# Patient Record
Sex: Female | Born: 1955
Health system: Southern US, Community
[De-identification: ages and names within clinical notes are randomized; demographics above are authoritative.]

## PROBLEM LIST (undated history)

## (undated) DIAGNOSIS — I471 Supraventricular tachycardia, unspecified: Secondary | ICD-10-CM

## (undated) DIAGNOSIS — I472 Ventricular tachycardia, unspecified: Secondary | ICD-10-CM

## (undated) DIAGNOSIS — Z6825 Body mass index (BMI) 25.0-25.9, adult: Secondary | ICD-10-CM

## (undated) HISTORY — PX: BREAST LUMPECTOMY: SHX2

## (undated) HISTORY — PX: CHOLECYSTECTOMY: SHX55

## (undated) HISTORY — DX: Body mass index (BMI) 25.0-25.9, adult: Z68.25

## (undated) HISTORY — DX: Supraventricular tachycardia, unspecified: I47.10

## (undated) HISTORY — PX: DILATION AND CURETTAGE OF UTERUS: SHX78

## (undated) HISTORY — PX: LAMINECTOMY: SHX219

## (undated) HISTORY — DX: Ventricular tachycardia, unspecified: I47.20

---

## 1998-03-28 ENCOUNTER — Inpatient Hospital Stay (HOSPITAL_COMMUNITY): Admission: RE | Admit: 1998-03-28 | Discharge: 1998-03-29 | Payer: Self-pay | Admitting: Neurosurgery

## 2018-04-06 DIAGNOSIS — N950 Postmenopausal bleeding: Secondary | ICD-10-CM

## 2021-08-20 DIAGNOSIS — Z6829 Body mass index (BMI) 29.0-29.9, adult: Secondary | ICD-10-CM | POA: Diagnosis not present

## 2021-08-20 DIAGNOSIS — I10 Essential (primary) hypertension: Secondary | ICD-10-CM | POA: Diagnosis not present

## 2021-08-20 DIAGNOSIS — N6459 Other signs and symptoms in breast: Secondary | ICD-10-CM | POA: Diagnosis not present

## 2021-08-20 DIAGNOSIS — Z1231 Encounter for screening mammogram for malignant neoplasm of breast: Secondary | ICD-10-CM | POA: Diagnosis not present

## 2021-09-11 DIAGNOSIS — N6453 Retraction of nipple: Secondary | ICD-10-CM | POA: Diagnosis not present

## 2021-09-11 DIAGNOSIS — N6459 Other signs and symptoms in breast: Secondary | ICD-10-CM | POA: Diagnosis not present

## 2021-09-11 DIAGNOSIS — R928 Other abnormal and inconclusive findings on diagnostic imaging of breast: Secondary | ICD-10-CM | POA: Diagnosis not present

## 2021-09-26 DIAGNOSIS — R921 Mammographic calcification found on diagnostic imaging of breast: Secondary | ICD-10-CM | POA: Diagnosis not present

## 2021-09-26 DIAGNOSIS — N6489 Other specified disorders of breast: Secondary | ICD-10-CM | POA: Diagnosis not present

## 2021-09-26 DIAGNOSIS — R928 Other abnormal and inconclusive findings on diagnostic imaging of breast: Secondary | ICD-10-CM | POA: Diagnosis not present

## 2021-09-30 DIAGNOSIS — N6331 Unspecified lump in axillary tail of the right breast: Secondary | ICD-10-CM | POA: Diagnosis not present

## 2021-09-30 DIAGNOSIS — R928 Other abnormal and inconclusive findings on diagnostic imaging of breast: Secondary | ICD-10-CM | POA: Diagnosis not present

## 2021-09-30 DIAGNOSIS — R590 Localized enlarged lymph nodes: Secondary | ICD-10-CM | POA: Diagnosis not present

## 2021-09-30 DIAGNOSIS — N6314 Unspecified lump in the right breast, lower inner quadrant: Secondary | ICD-10-CM | POA: Diagnosis not present

## 2021-09-30 DIAGNOSIS — N6315 Unspecified lump in the right breast, overlapping quadrants: Secondary | ICD-10-CM | POA: Diagnosis not present

## 2021-10-04 NOTE — Progress Notes (Signed)
Phone contact with pt who needs surgical referral after breast biopsy. The path was benign. However, the radiologist is recommending a beast MRI and surgical consult. Per the patient's request, an appt was made with Dr. Jerel Shepherd for 10/22/2020. The pt's primary is ordering the MRI.

## 2021-10-22 DIAGNOSIS — R590 Localized enlarged lymph nodes: Secondary | ICD-10-CM | POA: Diagnosis not present

## 2021-10-22 DIAGNOSIS — N6489 Other specified disorders of breast: Secondary | ICD-10-CM | POA: Diagnosis not present

## 2021-10-22 DIAGNOSIS — R928 Other abnormal and inconclusive findings on diagnostic imaging of breast: Secondary | ICD-10-CM | POA: Diagnosis not present

## 2021-10-25 ENCOUNTER — Other Ambulatory Visit: Payer: Self-pay | Admitting: Family Medicine

## 2021-10-25 DIAGNOSIS — N6459 Other signs and symptoms in breast: Secondary | ICD-10-CM

## 2021-11-06 ENCOUNTER — Ambulatory Visit
Admission: RE | Admit: 2021-11-06 | Discharge: 2021-11-06 | Disposition: A | Discharge status: Home / Self Care | Payer: Medicare HMO | Source: Ambulatory Visit | Attending: Family Medicine | Admitting: Family Medicine

## 2021-11-06 DIAGNOSIS — N6459 Other signs and symptoms in breast: Secondary | ICD-10-CM

## 2021-11-06 DIAGNOSIS — N6489 Other specified disorders of breast: Secondary | ICD-10-CM | POA: Diagnosis not present

## 2021-11-06 MED ORDER — GADOBUTROL 1 MMOL/ML IV SOLN
8.0000 mL | Freq: Once | INTRAVENOUS | Status: AC | PRN
  Administered 2021-11-06: 8 mL via INTRAVENOUS

## 2021-11-08 DIAGNOSIS — M85852 Other specified disorders of bone density and structure, left thigh: Secondary | ICD-10-CM | POA: Diagnosis not present

## 2021-11-08 DIAGNOSIS — E2839 Other primary ovarian failure: Secondary | ICD-10-CM | POA: Diagnosis not present

## 2021-11-13 ENCOUNTER — Other Ambulatory Visit: Payer: Self-pay | Admitting: Family Medicine

## 2021-11-13 DIAGNOSIS — R9389 Abnormal findings on diagnostic imaging of other specified body structures: Secondary | ICD-10-CM

## 2021-11-15 ENCOUNTER — Other Ambulatory Visit: Payer: Self-pay | Admitting: Family Medicine

## 2021-11-15 ENCOUNTER — Ambulatory Visit: Payer: Medicare HMO

## 2021-11-15 ENCOUNTER — Other Ambulatory Visit: Payer: Self-pay

## 2021-11-15 ENCOUNTER — Ambulatory Visit
Admission: RE | Admit: 2021-11-15 | Discharge: 2021-11-15 | Disposition: A | Discharge status: Home / Self Care | Payer: Medicare HMO | Source: Ambulatory Visit | Attending: Family Medicine | Admitting: Family Medicine

## 2021-11-15 DIAGNOSIS — R9389 Abnormal findings on diagnostic imaging of other specified body structures: Secondary | ICD-10-CM

## 2021-11-15 DIAGNOSIS — N651 Disproportion of reconstructed breast: Secondary | ICD-10-CM | POA: Diagnosis not present

## 2021-11-15 MED ORDER — GADOBUTROL 1 MMOL/ML IV SOLN: 8.0000 mL | Freq: Once | INTRAVENOUS | Status: DC | PRN

## 2021-11-20 ENCOUNTER — Other Ambulatory Visit: Payer: Self-pay

## 2021-11-20 ENCOUNTER — Ambulatory Visit
Admission: RE | Admit: 2021-11-20 | Discharge: 2021-11-20 | Disposition: A | Discharge status: Home / Self Care | Payer: Medicare HMO | Source: Ambulatory Visit | Attending: Family Medicine | Admitting: Family Medicine

## 2021-11-20 DIAGNOSIS — R9389 Abnormal findings on diagnostic imaging of other specified body structures: Secondary | ICD-10-CM

## 2021-11-20 DIAGNOSIS — R928 Other abnormal and inconclusive findings on diagnostic imaging of breast: Secondary | ICD-10-CM | POA: Diagnosis not present

## 2021-11-20 DIAGNOSIS — N6011 Diffuse cystic mastopathy of right breast: Secondary | ICD-10-CM | POA: Diagnosis not present

## 2021-11-20 MED ORDER — GADOBUTROL 1 MMOL/ML IV SOLN
8.0000 mL | Freq: Once | INTRAVENOUS | Status: AC | PRN
  Administered 2021-11-20: 8 mL via INTRAVENOUS

## 2021-12-04 DIAGNOSIS — N62 Hypertrophy of breast: Secondary | ICD-10-CM | POA: Diagnosis not present

## 2021-12-04 DIAGNOSIS — N6314 Unspecified lump in the right breast, lower inner quadrant: Secondary | ICD-10-CM | POA: Diagnosis not present

## 2021-12-04 DIAGNOSIS — N6011 Diffuse cystic mastopathy of right breast: Secondary | ICD-10-CM | POA: Diagnosis not present

## 2021-12-04 DIAGNOSIS — R928 Other abnormal and inconclusive findings on diagnostic imaging of breast: Secondary | ICD-10-CM | POA: Diagnosis not present

## 2021-12-04 DIAGNOSIS — N6315 Unspecified lump in the right breast, overlapping quadrants: Secondary | ICD-10-CM | POA: Diagnosis not present

## 2021-12-04 DIAGNOSIS — R590 Localized enlarged lymph nodes: Secondary | ICD-10-CM | POA: Diagnosis not present

## 2022-02-20 DIAGNOSIS — E785 Hyperlipidemia, unspecified: Secondary | ICD-10-CM | POA: Diagnosis not present

## 2022-02-20 DIAGNOSIS — R3915 Urgency of urination: Secondary | ICD-10-CM | POA: Diagnosis not present

## 2022-02-20 DIAGNOSIS — Z139 Encounter for screening, unspecified: Secondary | ICD-10-CM | POA: Diagnosis not present

## 2022-02-20 DIAGNOSIS — Z136 Encounter for screening for cardiovascular disorders: Secondary | ICD-10-CM | POA: Diagnosis not present

## 2022-02-20 DIAGNOSIS — E039 Hypothyroidism, unspecified: Secondary | ICD-10-CM | POA: Diagnosis not present

## 2022-02-20 DIAGNOSIS — I10 Essential (primary) hypertension: Secondary | ICD-10-CM | POA: Diagnosis not present

## 2022-02-20 DIAGNOSIS — Z0000 Encounter for general adult medical examination without abnormal findings: Secondary | ICD-10-CM | POA: Diagnosis not present

## 2022-02-20 DIAGNOSIS — Z1339 Encounter for screening examination for other mental health and behavioral disorders: Secondary | ICD-10-CM | POA: Diagnosis not present

## 2022-02-20 DIAGNOSIS — Z1331 Encounter for screening for depression: Secondary | ICD-10-CM | POA: Diagnosis not present

## 2022-06-04 DIAGNOSIS — N6489 Other specified disorders of breast: Secondary | ICD-10-CM | POA: Diagnosis not present

## 2022-06-04 DIAGNOSIS — R590 Localized enlarged lymph nodes: Secondary | ICD-10-CM | POA: Diagnosis not present

## 2022-06-10 DIAGNOSIS — N6011 Diffuse cystic mastopathy of right breast: Secondary | ICD-10-CM | POA: Diagnosis not present

## 2022-06-10 DIAGNOSIS — N6012 Diffuse cystic mastopathy of left breast: Secondary | ICD-10-CM | POA: Diagnosis not present

## 2022-08-25 DIAGNOSIS — I10 Essential (primary) hypertension: Secondary | ICD-10-CM | POA: Diagnosis not present

## 2022-08-25 DIAGNOSIS — E785 Hyperlipidemia, unspecified: Secondary | ICD-10-CM | POA: Diagnosis not present

## 2022-08-25 DIAGNOSIS — E039 Hypothyroidism, unspecified: Secondary | ICD-10-CM | POA: Diagnosis not present

## 2022-09-02 DIAGNOSIS — E039 Hypothyroidism, unspecified: Secondary | ICD-10-CM | POA: Diagnosis not present

## 2022-09-02 DIAGNOSIS — I10 Essential (primary) hypertension: Secondary | ICD-10-CM | POA: Diagnosis not present

## 2022-09-02 DIAGNOSIS — E785 Hyperlipidemia, unspecified: Secondary | ICD-10-CM | POA: Diagnosis not present

## 2022-09-02 DIAGNOSIS — K130 Diseases of lips: Secondary | ICD-10-CM | POA: Diagnosis not present

## 2022-09-02 DIAGNOSIS — Z6825 Body mass index (BMI) 25.0-25.9, adult: Secondary | ICD-10-CM | POA: Diagnosis not present

## 2022-09-17 DIAGNOSIS — G4730 Sleep apnea, unspecified: Secondary | ICD-10-CM | POA: Diagnosis not present

## 2022-10-02 DIAGNOSIS — Z1231 Encounter for screening mammogram for malignant neoplasm of breast: Secondary | ICD-10-CM | POA: Diagnosis not present

## 2022-10-30 DIAGNOSIS — D225 Melanocytic nevi of trunk: Secondary | ICD-10-CM | POA: Diagnosis not present

## 2022-10-30 DIAGNOSIS — L814 Other melanin hyperpigmentation: Secondary | ICD-10-CM | POA: Diagnosis not present

## 2022-10-30 DIAGNOSIS — L821 Other seborrheic keratosis: Secondary | ICD-10-CM | POA: Diagnosis not present

## 2022-10-30 DIAGNOSIS — L578 Other skin changes due to chronic exposure to nonionizing radiation: Secondary | ICD-10-CM | POA: Diagnosis not present

## 2022-10-30 DIAGNOSIS — D485 Neoplasm of uncertain behavior of skin: Secondary | ICD-10-CM | POA: Diagnosis not present

## 2022-11-26 IMAGING — MR MR BREAST*R* W/O CM
2 series · 40 of 48 positions shown · IV contrast (gadavist)
Comparison: November 06, 2021

CLINICAL DATA: The patient presented for biopsy of an enhancing
mass and distortion in the inner right breast.

EXAM:
MR OF THE RIGHT BREAST WITH AND WITHOUT CONTRAST
TECHNIQUE: Multiplanar, multisequence MR images of the right breast were
obtained prior to and following the intravenous administration of 0
ml of Gadavist.

[Series 2: fiducial unilateral · sagittal · 2.0mm · 1.33mm/px · 11 of 52 slices shown]
[im 1/52]
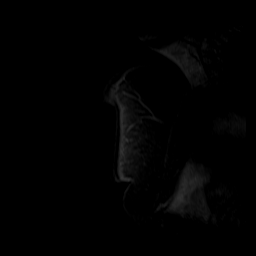
[im 6/52]
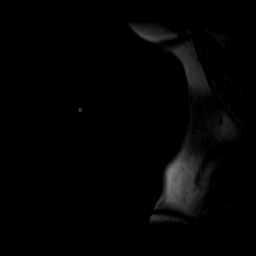
[im 11/52]
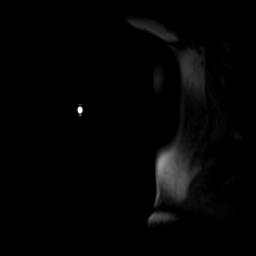
[im 16/52]
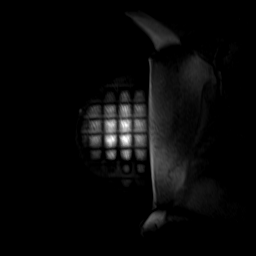
[im 21/52]
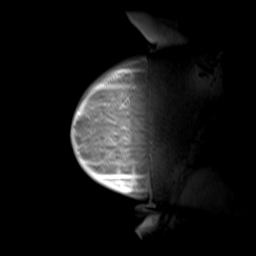
[im 26/52]
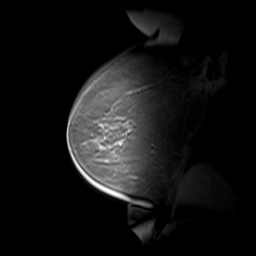
[im 31/52]
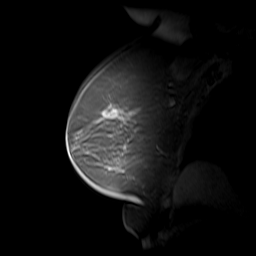
[im 36/52]
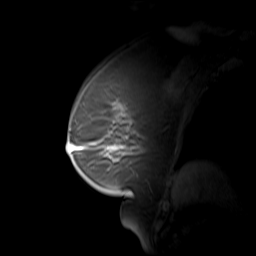
[im 41/52]
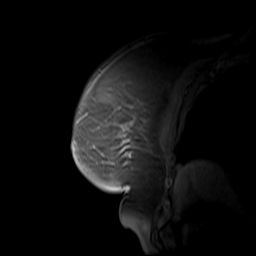
[im 46/52]
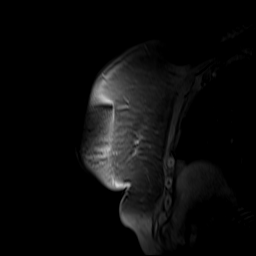
[im 52/52]
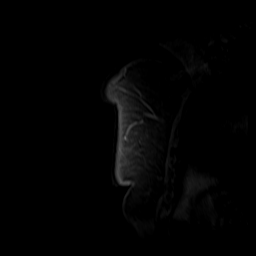

[Series 3: dynamic pre · axial · non-contrast · 1.3mm · 0.73mm/px · z∈[-76,+138]mm · 29 of 176 slices shown]
[im 1/176]
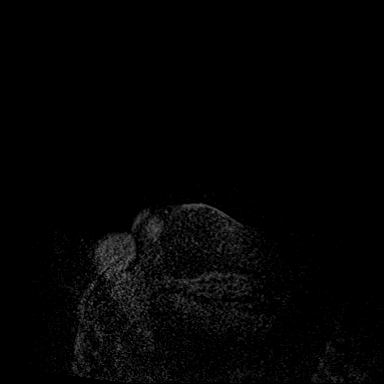
[im 5/176]
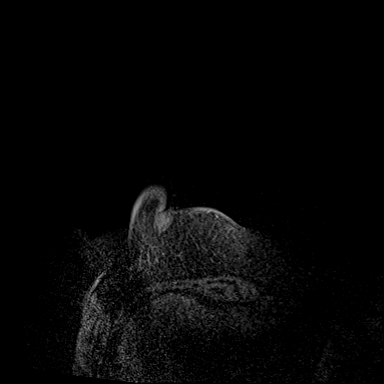
[im 10/176]
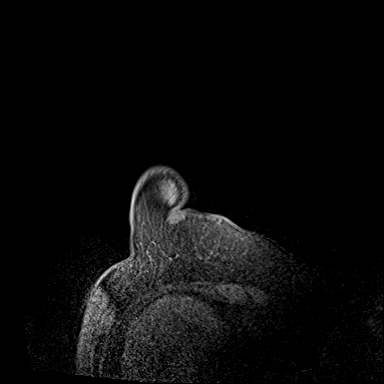
[im 15/176]
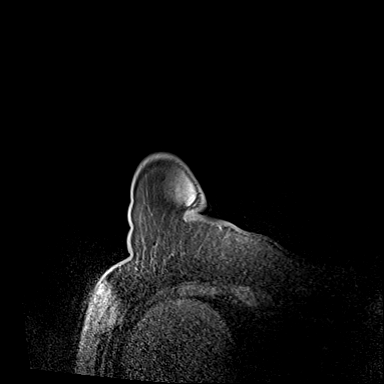
[im 20/176]
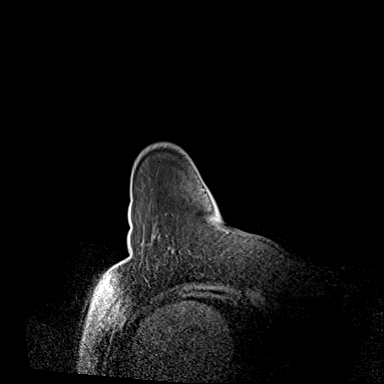
[im 25/176]
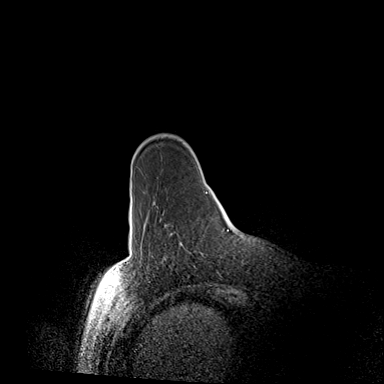
[im 30/176]
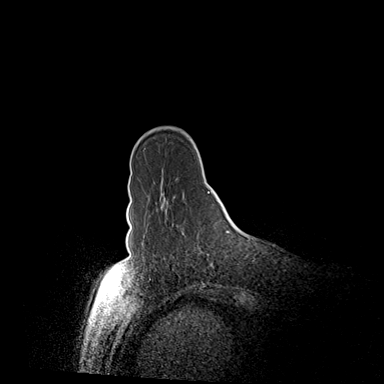
[im 35/176]
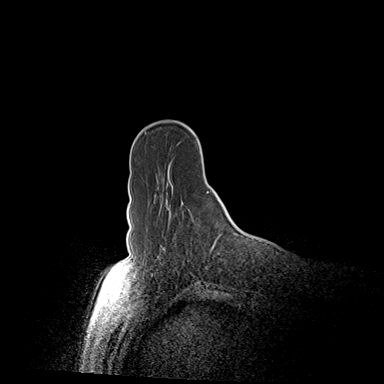
[im 39/176]
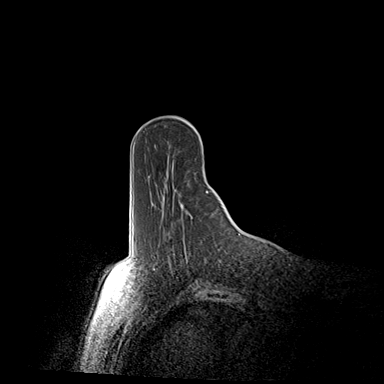
[im 44/176]
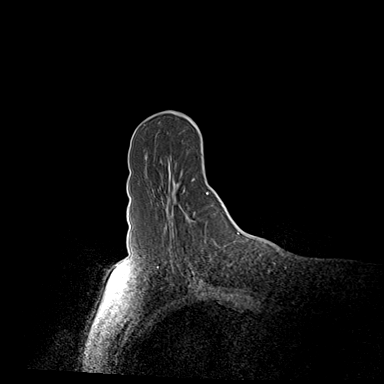
[im 49/176]
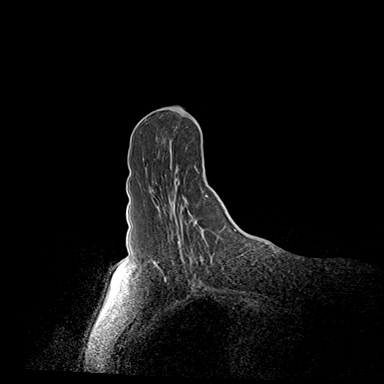
[im 54/176]
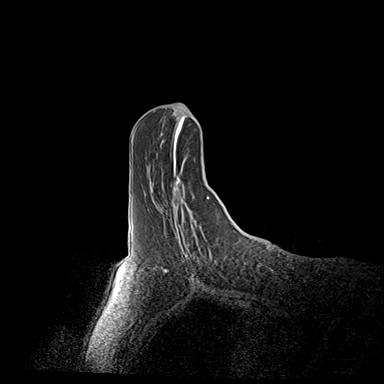
[im 59/176]
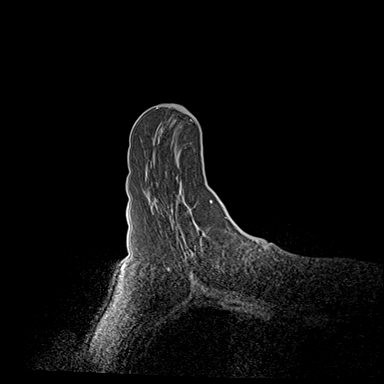
[im 64/176]
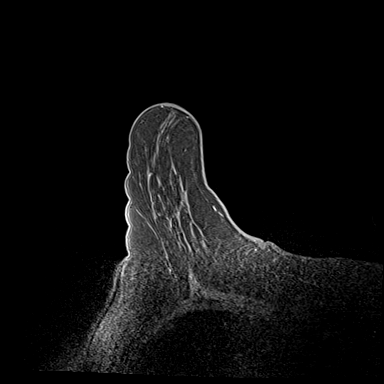
[im 69/176]
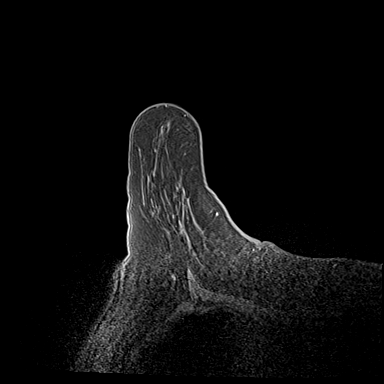
[im 73/176]
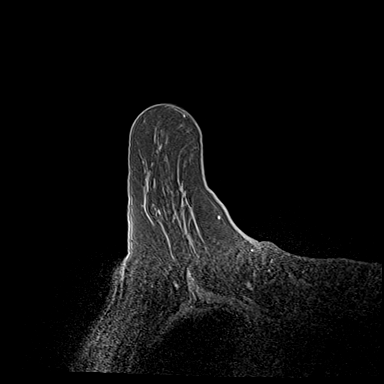
[im 78/176]
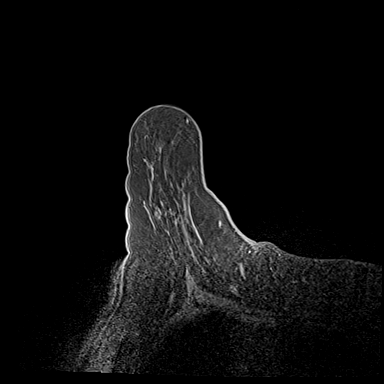
[im 83/176]
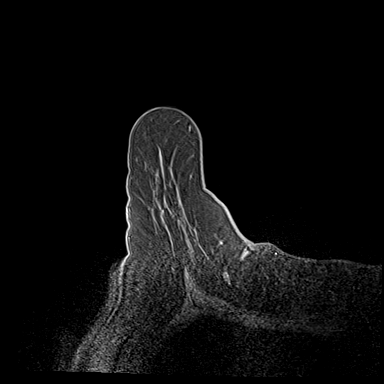
[im 88/176]
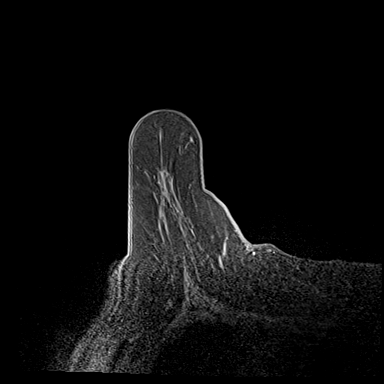
[im 93/176]
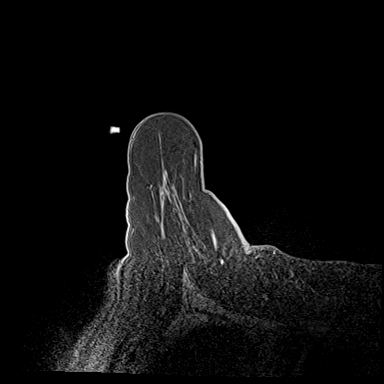
[im 98/176]
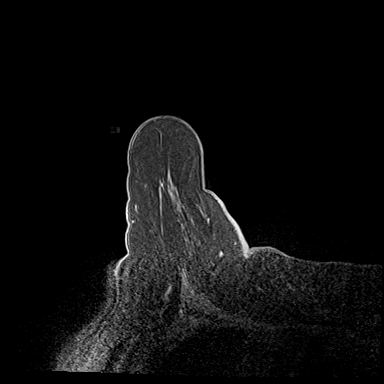
[im 103/176]
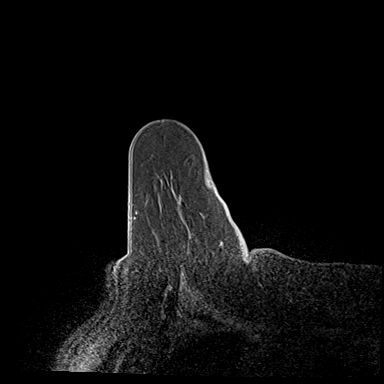
[im 107/176]
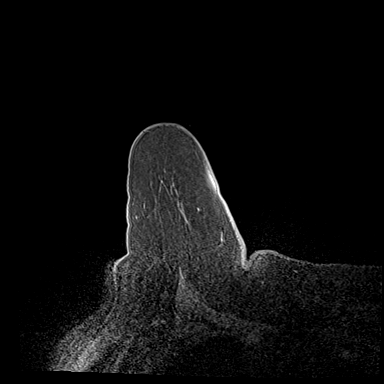
[im 112/176]
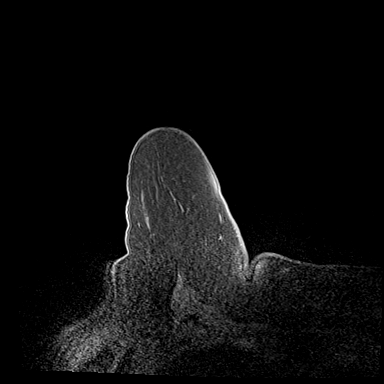
[im 117/176]
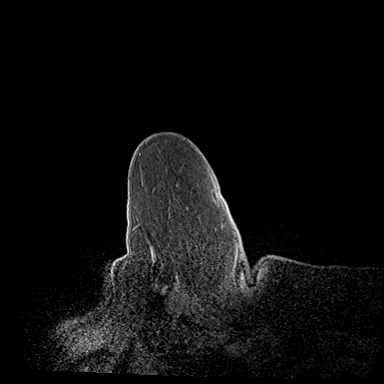
[im 122/176]
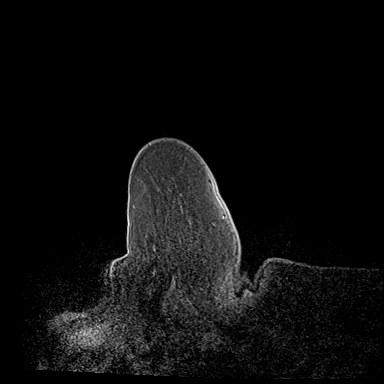
[im 127/176]
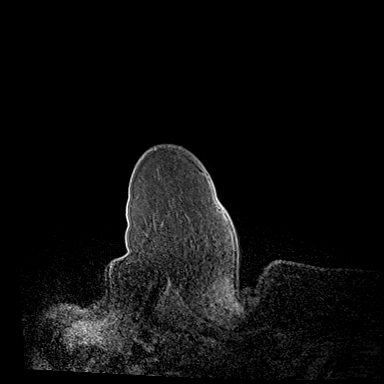
[im 146/176]
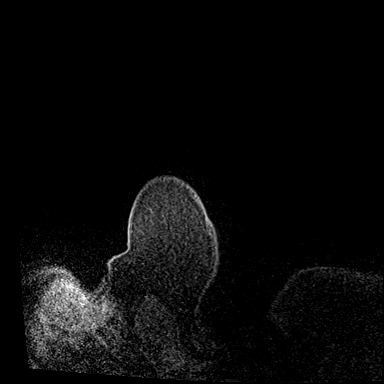
[im 166/176]
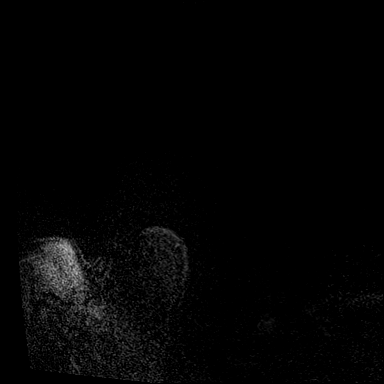

[40 of 48 positions shown; findings below may reference images not displayed]

Three-dimensional MR images were rendered by post-processing of the
original MR data on an independent workstation. The
three-dimensional MR images were interpreted, and findings are
reported in the following complete MRI report for this study. Three
dimensional images were evaluated at the independent [employer]
workstation
FINDINGS: A scout and a pre contrast images were obtained. The patient then
had a vasovagal reaction and 1 is in the able to proceed.
IMPRESSION: The MRI biopsy was canceled due to a vasovagal reaction. The patient
will be rescheduled. We will ask the referring physician to
prescribe an anxiolytic prior to the next attempt.

RECOMMENDATION:
The patient will be rescheduled for her biopsy.

BI-RADS CATEGORY  1: Negative.

## 2022-12-01 IMAGING — MR MR BREAST BX W/ LOC DEV 1ST LEASION IMAGE BX SPEC MR GUIDE*R*
7 of 10 series · 33 of 48 positions shown · IV contrast (gadavist)
Comparison: Previous exams.
COMPARISON: Previous exams.

Addendum:
CLINICAL DATA: 65-year-old female presents for MRI guided biopsy
enhancing mass/area of distortion in the inner right breast.

EXAM:
MRI GUIDED CORE NEEDLE BIOPSY OF THE RIGHT BREAST
TECHNIQUE: Multiplanar, multisequence MR imaging of the right breast was
performed both before and after administration of intravenous
contrast.
CONTRAST:  8mL GADAVIST GADOBUTROL 1 MMOL/ML IV SOLN

[Series 2: fiducial unilateral · sagittal · 2.0mm · 1.33mm/px · 3 of 52 slices shown]
[im 1/52]
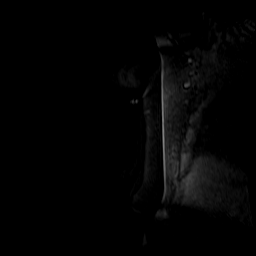
[im 26/52]
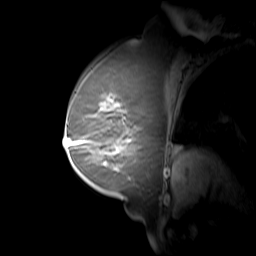
[im 52/52]
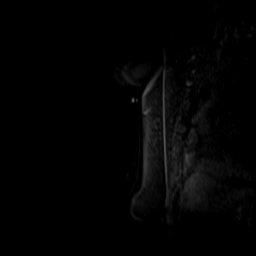

[Series 3: dynamic pre · axial · non-contrast · 1.3mm · 0.73mm/px · z∈[-86,+100]mm · 5 of 144 slices shown]
[im 1/144]
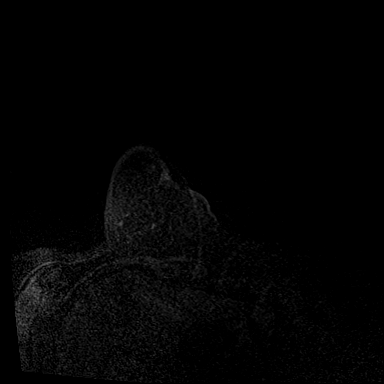
[im 36/144]
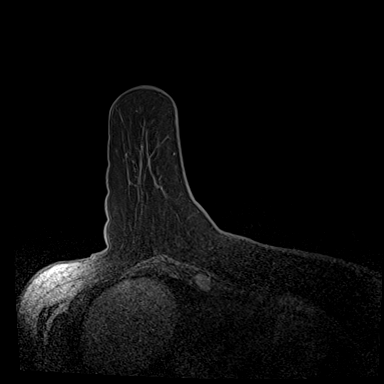
[im 72/144]
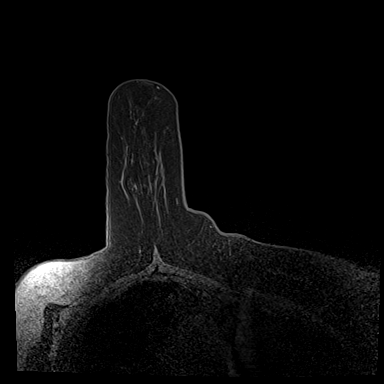
[im 108/144]
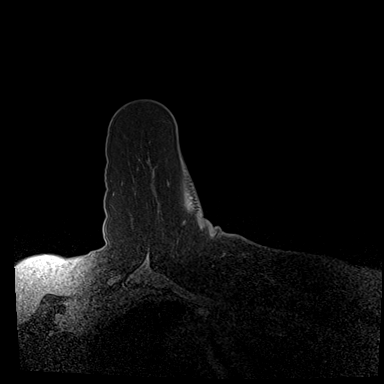
[im 144/144]
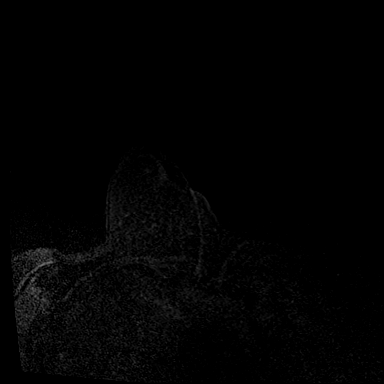

[Series 4: dynamic post 20 · axial · 1.3mm · 0.73mm/px · z∈[-86,+100]mm · 5 of 144 slices shown (1 of 2)]
[im 1/144]
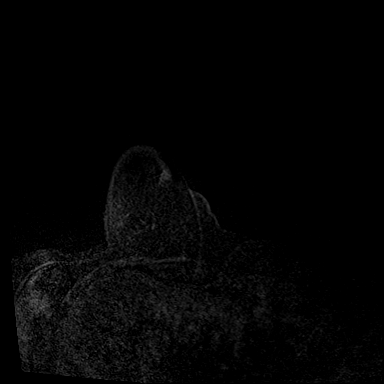
[im 36/144]
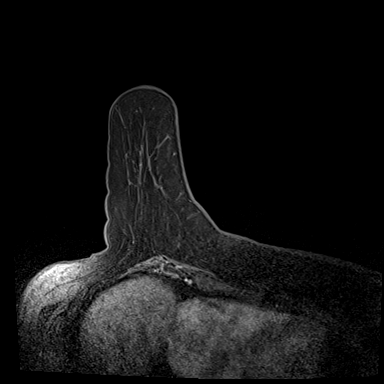
[im 72/144]
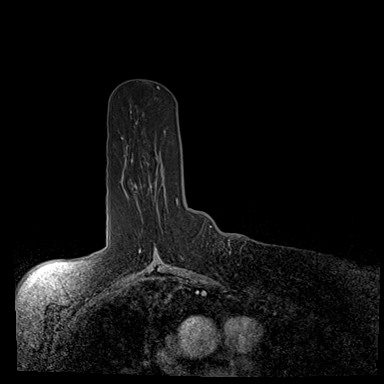
[im 108/144]
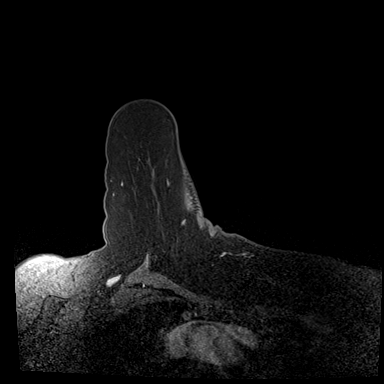
[im 144/144]
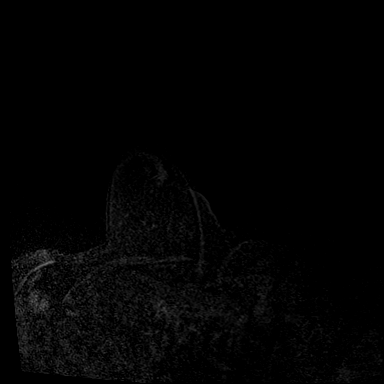

[Series 5: dynamic post 20 · axial · 1.3mm · 0.73mm/px · z∈[-86,+100]mm · 5 of 144 slices shown (2 of 2)]
[im 1/144]
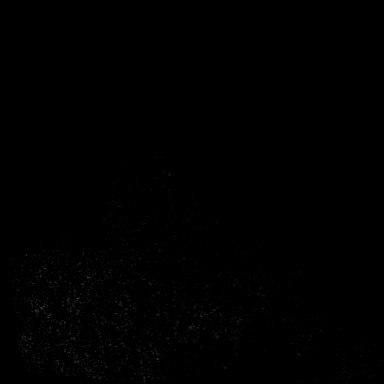
[im 36/144]
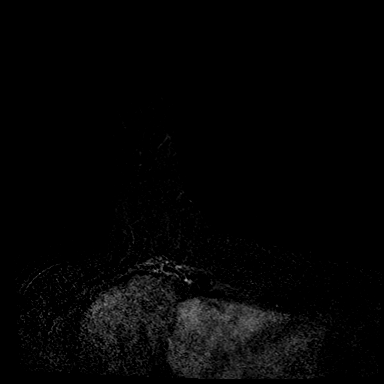
[im 72/144]
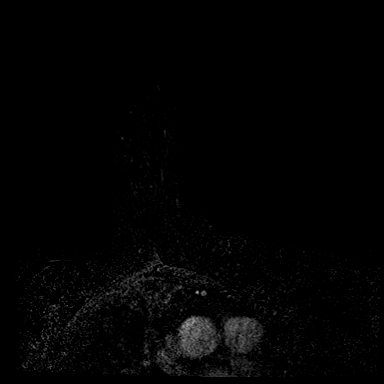
[im 108/144]
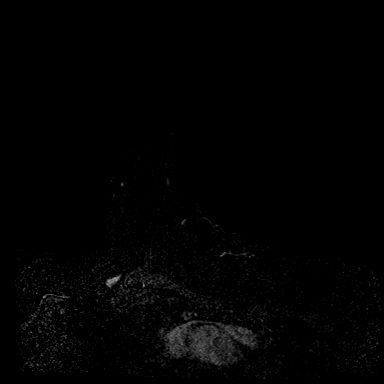
[im 144/144]
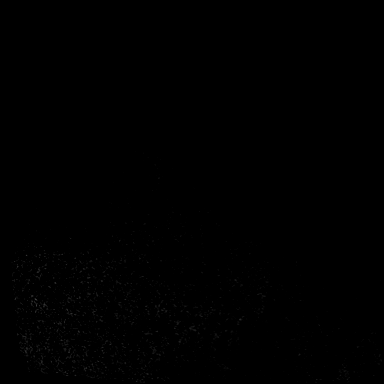

[Series 6: dynamic post 3 · axial · 1.3mm · 0.73mm/px · z∈[-86,+100]mm · 5 of 144 slices shown (1 of 2)]
[im 1/144]
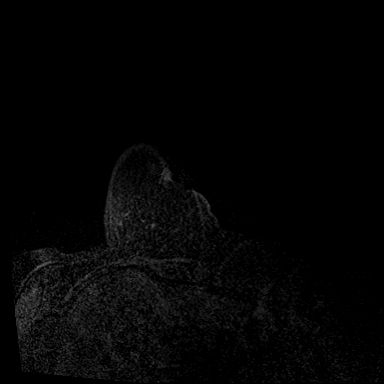
[im 36/144]
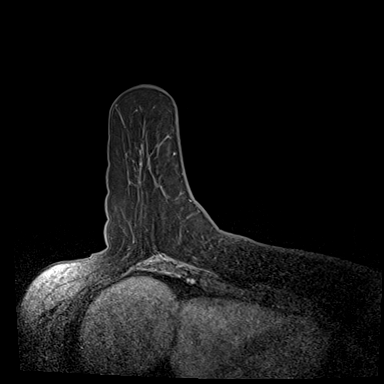
[im 72/144]
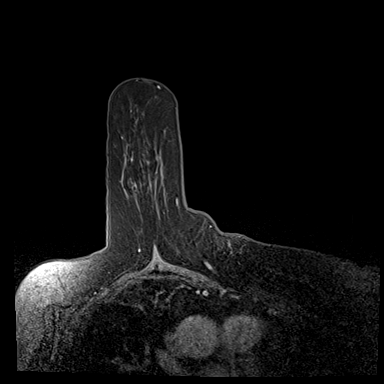
[im 108/144]
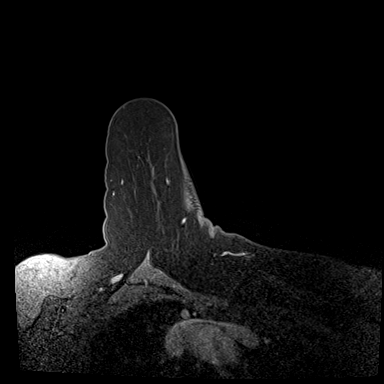
[im 144/144]
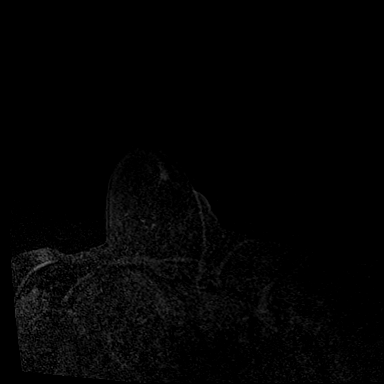

[Series 7: dynamic post 3 · axial · 1.3mm · 0.73mm/px · z∈[-86,+100]mm · 5 of 144 slices shown (2 of 2)]
[im 1/144]
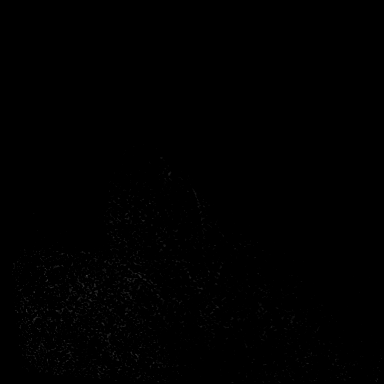
[im 36/144]
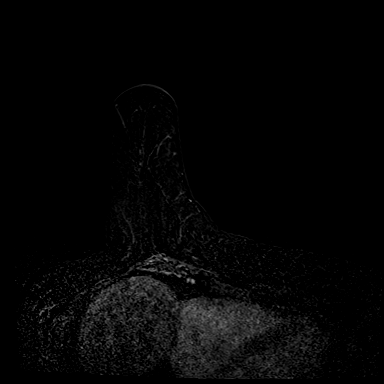
[im 72/144]
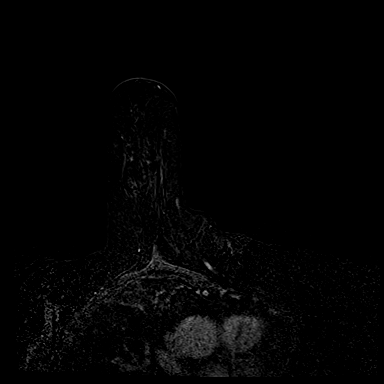
[im 108/144]
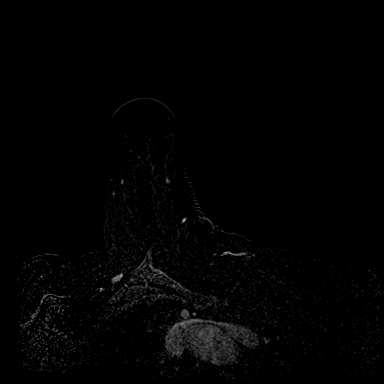
[im 144/144]
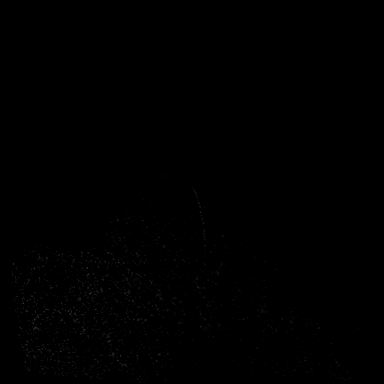

[Series 8: needle confirmation · axial · 1.3mm · 0.73mm/px · z∈[-86,+100]mm · 5 of 144 slices shown]
[im 1/144]
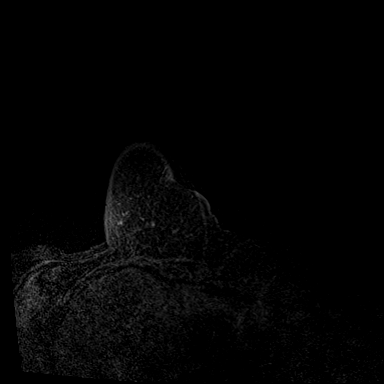
[im 36/144]
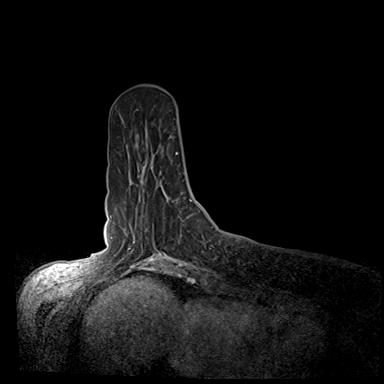
[im 72/144]
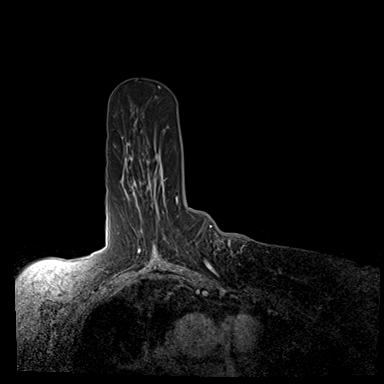
[im 108/144]
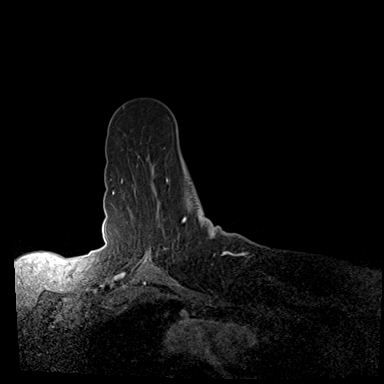
[im 144/144]
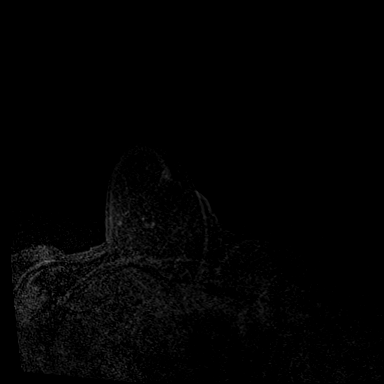

[33 of 48 positions shown; findings below may reference images not displayed]

FINDINGS: I met with the patient, and we discussed the procedure of MRI guided
biopsy, including risks, benefits, and alternatives. Specifically,
we discussed the risks of infection, bleeding, tissue injury, clip
migration, and inadequate sampling. Informed, written consent was
given. The usual time out protocol was performed immediately prior
to the procedure.

Using sterile technique, 1% Lidocaine, MRI guidance, and a 9 gauge
vacuum assisted device, biopsy was performed of enhancing
mass/distortion in the slightly inner right breast using a lateral
to medial approach. At the conclusion of the procedure, a dumbbell
shaped tissue marker clip was deployed into the biopsy cavity.
Follow-up 2-view mammogram was performed and dictated separately.
IMPRESSION: MRI guided biopsy of the enhancing mass/distortion in the inner
right breast. No apparent complications.

ADDENDUM:
Pathology revealed COLUMNAR CELL AND FIBROCYSTIC CHANGES-
FIBROELASTIC STROMA WITH PIGMENTED HISTIOCYTES CONSISTENT WITH
PREVIOUS BIOPSY SITE- NO MALIGNANCY IDENTIFIED of the RIGHT breast,
inner (dumbbell clip). This was found to be DISCORDANT by Dr.
Gab Tiger.

Pathology results were discussed with the patient by telephone. The
patient reported doing well after the biopsy with tenderness at the
site. Post biopsy instructions and care were reviewed and questions
were answered. The patient was encouraged to call The [REDACTED]

Surgical excision was recommended to the patient. This was discussed
with Ms. Rosenborg At [DATE] a.m. 11/26/2021 via telephone. The patient
states she has already been in touch with her surgeon Dr. Renardo
La and she will be following up to discuss results and
schedule excision.

Pathology results reported by Brittany Orea RN on 11/21/2021.

*** End of Addendum ***
FINDINGS: I met with the patient, and we discussed the procedure of MRI guided
biopsy, including risks, benefits, and alternatives. Specifically,
we discussed the risks of infection, bleeding, tissue injury, clip
migration, and inadequate sampling. Informed, written consent was
given. The usual time out protocol was performed immediately prior
to the procedure.

Using sterile technique, 1% Lidocaine, MRI guidance, and a 9 gauge
vacuum assisted device, biopsy was performed of enhancing
mass/distortion in the slightly inner right breast using a lateral
to medial approach. At the conclusion of the procedure, a dumbbell
shaped tissue marker clip was deployed into the biopsy cavity.
Follow-up 2-view mammogram was performed and dictated separately.
IMPRESSION: MRI guided biopsy of the enhancing mass/distortion in the inner
right breast. No apparent complications.

## 2022-12-02 DIAGNOSIS — Z1211 Encounter for screening for malignant neoplasm of colon: Secondary | ICD-10-CM | POA: Diagnosis not present

## 2022-12-02 DIAGNOSIS — N6012 Diffuse cystic mastopathy of left breast: Secondary | ICD-10-CM | POA: Diagnosis not present

## 2022-12-02 DIAGNOSIS — N6011 Diffuse cystic mastopathy of right breast: Secondary | ICD-10-CM | POA: Diagnosis not present

## 2023-02-13 ENCOUNTER — Telehealth: Payer: Self-pay | Admitting: Gastroenterology

## 2023-02-13 NOTE — Telephone Encounter (Signed)
Darl Pikes from [facility] called and stated that the patient is due for a procedure but need clearance from [place] to be able to continue. The patient stated that she had a colonoscopy with Dr.Gupta but does not know where or when and she is trying to find out so the surgery can be approved. A good phone number to call back on is [phone] and ask for Darl Pikes. Stated they close at 66 today. Please advise, thank you.

## 2023-02-17 NOTE — Telephone Encounter (Signed)
Sent report from 08-20-2023 to F [phone] attn Darl Pikes

## 2023-02-17 NOTE — Telephone Encounter (Signed)
Please see note below and please assist.

## 2023-02-24 DIAGNOSIS — E039 Hypothyroidism, unspecified: Secondary | ICD-10-CM | POA: Diagnosis not present

## 2023-02-24 DIAGNOSIS — E785 Hyperlipidemia, unspecified: Secondary | ICD-10-CM | POA: Diagnosis not present

## 2023-02-24 DIAGNOSIS — I10 Essential (primary) hypertension: Secondary | ICD-10-CM | POA: Diagnosis not present

## 2023-02-26 DIAGNOSIS — Z1211 Encounter for screening for malignant neoplasm of colon: Secondary | ICD-10-CM | POA: Diagnosis not present

## 2023-03-03 DIAGNOSIS — E785 Hyperlipidemia, unspecified: Secondary | ICD-10-CM | POA: Diagnosis not present

## 2023-03-03 DIAGNOSIS — Z1331 Encounter for screening for depression: Secondary | ICD-10-CM | POA: Diagnosis not present

## 2023-03-03 DIAGNOSIS — G4733 Obstructive sleep apnea (adult) (pediatric): Secondary | ICD-10-CM | POA: Diagnosis not present

## 2023-03-03 DIAGNOSIS — Z0000 Encounter for general adult medical examination without abnormal findings: Secondary | ICD-10-CM | POA: Diagnosis not present

## 2023-03-03 DIAGNOSIS — Z136 Encounter for screening for cardiovascular disorders: Secondary | ICD-10-CM | POA: Diagnosis not present

## 2023-03-03 DIAGNOSIS — Z1339 Encounter for screening examination for other mental health and behavioral disorders: Secondary | ICD-10-CM | POA: Diagnosis not present

## 2023-03-03 DIAGNOSIS — I10 Essential (primary) hypertension: Secondary | ICD-10-CM | POA: Diagnosis not present

## 2023-03-03 DIAGNOSIS — E039 Hypothyroidism, unspecified: Secondary | ICD-10-CM | POA: Diagnosis not present

## 2023-03-03 DIAGNOSIS — Z139 Encounter for screening, unspecified: Secondary | ICD-10-CM | POA: Diagnosis not present

## 2023-04-10 DIAGNOSIS — Z8616 Personal history of COVID-19: Secondary | ICD-10-CM | POA: Diagnosis not present

## 2023-04-10 DIAGNOSIS — G4733 Obstructive sleep apnea (adult) (pediatric): Secondary | ICD-10-CM | POA: Diagnosis not present

## 2023-04-10 DIAGNOSIS — R5383 Other fatigue: Secondary | ICD-10-CM | POA: Diagnosis not present

## 2023-04-29 DIAGNOSIS — G4733 Obstructive sleep apnea (adult) (pediatric): Secondary | ICD-10-CM | POA: Diagnosis not present

## 2023-04-29 DIAGNOSIS — Z711 Person with feared health complaint in whom no diagnosis is made: Secondary | ICD-10-CM | POA: Diagnosis not present

## 2023-05-06 DIAGNOSIS — R400 Somnolence: Secondary | ICD-10-CM | POA: Diagnosis not present

## 2023-05-06 DIAGNOSIS — R5383 Other fatigue: Secondary | ICD-10-CM | POA: Diagnosis not present

## 2023-08-27 DIAGNOSIS — E785 Hyperlipidemia, unspecified: Secondary | ICD-10-CM | POA: Diagnosis not present

## 2023-08-27 DIAGNOSIS — E039 Hypothyroidism, unspecified: Secondary | ICD-10-CM | POA: Diagnosis not present

## 2023-08-27 DIAGNOSIS — I10 Essential (primary) hypertension: Secondary | ICD-10-CM | POA: Diagnosis not present

## 2023-09-03 DIAGNOSIS — Z6824 Body mass index (BMI) 24.0-24.9, adult: Secondary | ICD-10-CM | POA: Diagnosis not present

## 2023-09-03 DIAGNOSIS — I10 Essential (primary) hypertension: Secondary | ICD-10-CM | POA: Diagnosis not present

## 2023-09-03 DIAGNOSIS — E039 Hypothyroidism, unspecified: Secondary | ICD-10-CM | POA: Diagnosis not present

## 2023-09-03 DIAGNOSIS — S99921A Unspecified injury of right foot, initial encounter: Secondary | ICD-10-CM | POA: Diagnosis not present

## 2023-09-03 DIAGNOSIS — E785 Hyperlipidemia, unspecified: Secondary | ICD-10-CM | POA: Diagnosis not present

## 2023-10-05 DIAGNOSIS — Z1231 Encounter for screening mammogram for malignant neoplasm of breast: Secondary | ICD-10-CM | POA: Diagnosis not present

## 2023-11-23 DIAGNOSIS — N6012 Diffuse cystic mastopathy of left breast: Secondary | ICD-10-CM | POA: Diagnosis not present

## 2023-11-23 DIAGNOSIS — N6011 Diffuse cystic mastopathy of right breast: Secondary | ICD-10-CM | POA: Diagnosis not present

## 2023-12-15 ENCOUNTER — Encounter: Payer: Self-pay | Admitting: Gastroenterology

## 2023-12-22 DIAGNOSIS — H2513 Age-related nuclear cataract, bilateral: Secondary | ICD-10-CM | POA: Diagnosis not present

## 2023-12-22 DIAGNOSIS — H2511 Age-related nuclear cataract, right eye: Secondary | ICD-10-CM | POA: Diagnosis not present

## 2023-12-22 DIAGNOSIS — Z01818 Encounter for other preprocedural examination: Secondary | ICD-10-CM | POA: Diagnosis not present

## 2024-01-26 DIAGNOSIS — E039 Hypothyroidism, unspecified: Secondary | ICD-10-CM | POA: Diagnosis not present

## 2024-01-26 DIAGNOSIS — H259 Unspecified age-related cataract: Secondary | ICD-10-CM | POA: Diagnosis not present

## 2024-01-26 DIAGNOSIS — H43393 Other vitreous opacities, bilateral: Secondary | ICD-10-CM | POA: Diagnosis not present

## 2024-01-26 DIAGNOSIS — H04123 Dry eye syndrome of bilateral lacrimal glands: Secondary | ICD-10-CM | POA: Diagnosis not present

## 2024-01-26 DIAGNOSIS — Z79899 Other long term (current) drug therapy: Secondary | ICD-10-CM | POA: Diagnosis not present

## 2024-01-26 DIAGNOSIS — H2511 Age-related nuclear cataract, right eye: Secondary | ICD-10-CM | POA: Diagnosis not present

## 2024-01-26 DIAGNOSIS — H524 Presbyopia: Secondary | ICD-10-CM | POA: Diagnosis not present

## 2024-01-26 DIAGNOSIS — Z961 Presence of intraocular lens: Secondary | ICD-10-CM | POA: Diagnosis not present

## 2024-01-26 DIAGNOSIS — K219 Gastro-esophageal reflux disease without esophagitis: Secondary | ICD-10-CM | POA: Diagnosis not present

## 2024-01-26 DIAGNOSIS — H40013 Open angle with borderline findings, low risk, bilateral: Secondary | ICD-10-CM | POA: Diagnosis not present

## 2024-01-26 DIAGNOSIS — H25812 Combined forms of age-related cataract, left eye: Secondary | ICD-10-CM | POA: Diagnosis not present

## 2024-01-26 DIAGNOSIS — H52223 Regular astigmatism, bilateral: Secondary | ICD-10-CM | POA: Diagnosis not present

## 2024-01-26 DIAGNOSIS — H25811 Combined forms of age-related cataract, right eye: Secondary | ICD-10-CM | POA: Diagnosis not present

## 2024-01-26 DIAGNOSIS — H02834 Dermatochalasis of left upper eyelid: Secondary | ICD-10-CM | POA: Diagnosis not present

## 2024-01-26 DIAGNOSIS — I10 Essential (primary) hypertension: Secondary | ICD-10-CM | POA: Diagnosis not present

## 2024-02-09 DIAGNOSIS — H25812 Combined forms of age-related cataract, left eye: Secondary | ICD-10-CM | POA: Diagnosis not present

## 2024-02-09 DIAGNOSIS — H259 Unspecified age-related cataract: Secondary | ICD-10-CM | POA: Diagnosis not present

## 2024-02-09 DIAGNOSIS — H25811 Combined forms of age-related cataract, right eye: Secondary | ICD-10-CM | POA: Diagnosis not present

## 2024-02-09 DIAGNOSIS — I10 Essential (primary) hypertension: Secondary | ICD-10-CM | POA: Diagnosis not present

## 2024-02-09 DIAGNOSIS — H52223 Regular astigmatism, bilateral: Secondary | ICD-10-CM | POA: Diagnosis not present

## 2024-02-09 DIAGNOSIS — K219 Gastro-esophageal reflux disease without esophagitis: Secondary | ICD-10-CM | POA: Diagnosis not present

## 2024-03-01 DIAGNOSIS — E785 Hyperlipidemia, unspecified: Secondary | ICD-10-CM | POA: Diagnosis not present

## 2024-03-01 DIAGNOSIS — E039 Hypothyroidism, unspecified: Secondary | ICD-10-CM | POA: Diagnosis not present

## 2024-03-01 DIAGNOSIS — I10 Essential (primary) hypertension: Secondary | ICD-10-CM | POA: Diagnosis not present

## 2024-03-09 DIAGNOSIS — Z139 Encounter for screening, unspecified: Secondary | ICD-10-CM | POA: Diagnosis not present

## 2024-03-09 DIAGNOSIS — E039 Hypothyroidism, unspecified: Secondary | ICD-10-CM | POA: Diagnosis not present

## 2024-03-09 DIAGNOSIS — Z1389 Encounter for screening for other disorder: Secondary | ICD-10-CM | POA: Diagnosis not present

## 2024-03-09 DIAGNOSIS — E785 Hyperlipidemia, unspecified: Secondary | ICD-10-CM | POA: Diagnosis not present

## 2024-03-09 DIAGNOSIS — I10 Essential (primary) hypertension: Secondary | ICD-10-CM | POA: Diagnosis not present

## 2024-03-09 DIAGNOSIS — Z6824 Body mass index (BMI) 24.0-24.9, adult: Secondary | ICD-10-CM | POA: Diagnosis not present

## 2024-03-09 DIAGNOSIS — Z0000 Encounter for general adult medical examination without abnormal findings: Secondary | ICD-10-CM | POA: Diagnosis not present

## 2024-03-09 DIAGNOSIS — R002 Palpitations: Secondary | ICD-10-CM | POA: Diagnosis not present

## 2024-03-09 DIAGNOSIS — Z1339 Encounter for screening examination for other mental health and behavioral disorders: Secondary | ICD-10-CM | POA: Diagnosis not present

## 2024-03-28 DIAGNOSIS — R002 Palpitations: Secondary | ICD-10-CM | POA: Diagnosis not present

## 2024-04-07 DIAGNOSIS — I471 Supraventricular tachycardia, unspecified: Secondary | ICD-10-CM | POA: Diagnosis not present

## 2024-04-07 DIAGNOSIS — Z6825 Body mass index (BMI) 25.0-25.9, adult: Secondary | ICD-10-CM | POA: Diagnosis not present

## 2024-04-07 DIAGNOSIS — I472 Ventricular tachycardia, unspecified: Secondary | ICD-10-CM | POA: Diagnosis not present

## 2024-04-13 ENCOUNTER — Other Ambulatory Visit: Payer: Self-pay

## 2024-04-13 DIAGNOSIS — I4729 Other ventricular tachycardia: Secondary | ICD-10-CM | POA: Insufficient documentation

## 2024-04-13 DIAGNOSIS — I471 Supraventricular tachycardia, unspecified: Secondary | ICD-10-CM | POA: Insufficient documentation

## 2024-04-13 DIAGNOSIS — I472 Ventricular tachycardia, unspecified: Secondary | ICD-10-CM | POA: Insufficient documentation

## 2024-04-13 DIAGNOSIS — Z6825 Body mass index (BMI) 25.0-25.9, adult: Secondary | ICD-10-CM | POA: Insufficient documentation

## 2024-04-27 ENCOUNTER — Ambulatory Visit

## 2024-04-27 VITALS — BP 130/90 | HR 77 | Ht 65.0 in | Wt 149.0 lb

## 2024-04-27 DIAGNOSIS — I4729 Other ventricular tachycardia: Secondary | ICD-10-CM

## 2024-04-27 DIAGNOSIS — I10 Essential (primary) hypertension: Secondary | ICD-10-CM

## 2024-04-27 DIAGNOSIS — I471 Supraventricular tachycardia, unspecified: Secondary | ICD-10-CM | POA: Diagnosis not present

## 2024-04-27 HISTORY — DX: Essential (primary) hypertension: I10

## 2024-04-27 MED ORDER — CARVEDILOL 3.125 MG PO TABS: 3.1250 mg | ORAL_TABLET | Freq: Two times a day (BID) | ORAL | 3 refills | Status: DC

## 2024-04-27 NOTE — Assessment & Plan Note (Signed)
 Suboptimal control. Currently on amlodipine 2.5 mg once daily.  Tolerating well. Would recommend starting carvedilol  as reviewed above under NSVT, starting 3.125 mg twice daily.

## 2024-04-27 NOTE — Assessment & Plan Note (Signed)
 1 episode of 6 beat NSVT, symptomatic on Zio patch May 2025.  Has potential risk factors in the form of prior history of sleep apnea, hypertension.  Will further evaluate for structural issue with transthoracic echocardiogram. Will also evaluate with treadmill EKG stress test with nuclear imaging given the symptomatic NSVT episodes to rule out any ischemia triggered events.  Will add low-dose beta-blocker carvedilol  3.125 mg twice daily, discussed mechanism of action and potential side effects. Patient also help with blood pressure optimization.

## 2024-04-27 NOTE — Assessment & Plan Note (Signed)
 Runs of SVT noted on telemetry 12 episodes noted with the longest episode lasting 38 seconds and appears consistent with ectopic atrial tachycardia.  Continue workup with echocardiogram as reviewed above under NSVT. Potential triggers reviewed. Recommended further evaluation for sleep apnea, reevaluation through PCPs office and she feels her sleep quality was better while on CPAP.

## 2024-04-27 NOTE — Progress Notes (Signed)
 Cardiology Consultation:    Date:  04/27/2024   ID:  Christy Crane, DOB May 26, 1956, MRN [id number]  PCP:  Patient, No Pcp Per  Cardiologist:  Alean JONELLE Kobus, MD   Referring MD: Melonie Colonel, Mikel HERO, *   No chief complaint on file.    ASSESSMENT AND PLAN:   Christy Crane 68 year old woman with history of hypertension, hyperlipidemia, hypothyroidism, previously sleep apnea [rechecked around 2024/02/16with home sleep study and noted to have normal sleep apnea symptoms and CPAP discontinued].  Problem List Items Addressed This Visit     SVT (supraventricular tachycardia) (HCC)   Runs of SVT noted on telemetry 12 episodes noted with the longest episode lasting 38 seconds and appears consistent with ectopic atrial tachycardia.  Continue workup with echocardiogram as reviewed above under NSVT. Potential triggers reviewed. Recommended further evaluation for sleep apnea, reevaluation through PCPs office and she feels her sleep quality was better while on CPAP.        Relevant Orders   EKG 12-Lead (Completed)   NSVT (nonsustained ventricular tachycardia) (HCC) - Primary   1 episode of 6 beat NSVT, symptomatic on Zio patch May 2025.  Has potential risk factors in the form of prior history of sleep apnea, hypertension.  Will further evaluate for structural issue with transthoracic echocardiogram. Will also evaluate with treadmill EKG stress test with nuclear imaging given the symptomatic NSVT episodes to rule out any ischemia triggered events.  Will add low-dose beta-blocker carvedilol  3.125 mg twice daily, discussed mechanism of action and potential side effects. Patient also help with blood pressure optimization.       Relevant Orders   Cardiac Stress Test: Informed Consent Details: Physician/Practitioner Attestation; Transcribe to consent form and obtain patient signature   Hypertension   Suboptimal control. Currently on amlodipine 2.5 mg once daily.  Tolerating  well. Would recommend starting carvedilol  as reviewed above under NSVT, starting 3.125 mg twice daily.      Return to clinic tentatively in 3 months.   History of Present Illness:    Christy Crane is a 68 y.o. female who is being seen today for the evaluation of 6 beat run of NSVT noted on heart monitor that was seemingly symptomatic and multiple runs of SVT with the longest lasting 38 seconds that was asymptomatic at the request of Melonie Colonel, Irma M, *.  Pleasant woman here for the visit by herself.  Lives by herself at home since her husband passed away in 2022/12/05.  Keeps herself busy with household activities in the yard and also regularly exercises 30 minutes on the stationary bike and light strength training.  And lost about 90 pounds since 2020.  Has history of hypertension, hyperlipidemia, hypothyroidism, previously sleep apnea [rechecked around 2024/02/16with home sleep study and noted to have normal sleep apnea symptoms and CPAP discontinued]. Remote history of stress test at age 89 that was normal [done at the time given family history of heart disease]  Overall has been doing well but over the past few months has been noticing symptoms of palpitations which she describes as fluttering like sensation most notable while at rest going to bed.  Short lasting for seconds.  No other associated symptoms such as chest pain shortness of breath, lightheadedness, dizziness or syncopal episodes.  Good compliance with her medications. Recently amlodipine dose was lowered to 2.5 mg daily. Similarly simvastatin currently she is on 10 mg every other day.  EKG in the clinic today shows sinus  rhythm heart rate 77/min, PR interval 140 ms, QRS duration 70 ms, no ischemic changes.  Normal QTc 411 ms.    Blood work from 03/09/2024 notes hemoglobin 15.1, hematocrit 46.7, WBC 4 1 platelets 192. BUN 16, creatinine 0.81 Sodium 139, potassium 4 Transaminases and alkaline phosphatase  normal. Lipid panel with total cholesterol 169, triglycerides 81, HDL 65 and LDL 89. TSH normal 1.4.  Zio patch monitor 14 days study from 03/09/2024 results reviewed and tracings reviewed show predominantly sinus rhythm with average heart rate 77/min [ranging from 53 to 140 bpm].  Rare ventricular and supraventricular ectopy burden less than 1%.  1 incidence of NSVT lasting 6 beats was identified within 45 seconds of a triggered event at 8:58 PM on 03/22/2024 and 12 instances of SVT with the longest episode lasting 38 seconds appears consistent with ectopic atrial tachycardia0.  There were 5 total triggers and diary entries 1 of which correlated with the run of NSVT and the rest correlated with normal heart rate and rhythm.  No other sustained arrhythmias or pauses.   Past Medical History:  Diagnosis Date   BMI 25.0-25.9,adult    SVT (supraventricular tachycardia) (HCC)    Ventricular tachycardia (HCC)     Past Surgical History:  Procedure Laterality Date   BREAST LUMPECTOMY     CESAREAN SECTION     CHOLECYSTECTOMY     DILATION AND CURETTAGE OF UTERUS     LAMINECTOMY      Current Medications: Current Meds  Medication Sig   amLODipine (NORVASC) 2.5 MG tablet Take 2.5 mg by mouth daily.   Cholecalciferol (D3 5000) 125 MCG (5000 UT) capsule Take 5,000 Units by mouth daily.   cyclobenzaprine (FLEXERIL) 10 MG tablet Take 10 mg by mouth 3 (three) times daily as needed for muscle spasms.   levothyroxine (SYNTHROID) 88 MCG tablet Take 88 mcg by mouth daily.   Magnesium Malate 1250 (141.7 Mg) MG TABS Take 1 tablet by mouth daily.   Multiple Vitamin (MULTIVITAMIN PO) Take 1 tablet by mouth daily.   omeprazole (PRILOSEC) 20 MG capsule Take 20 mg by mouth daily.   simvastatin (ZOCOR) 10 MG tablet Take 10 mg by mouth every other day.   tolterodine (DETROL LA) 4 MG 24 hr capsule Take 4 mg by mouth daily.     Allergies:   Aspirin and Oxycodone   Social History   Socioeconomic History    Marital status: Married    Spouse name: Not on file   Number of children: Not on file   Years of education: Not on file   Highest education level: Not on file  Occupational History   Not on file  Tobacco Use   Smoking status: Never   Smokeless tobacco: Never  Vaping Use   Vaping status: Never Used  Substance and Sexual Activity   Alcohol  use: Not Currently    Comment: Occasional   Drug use: Never   Sexual activity: Not on file  Other Topics Concern   Not on file  Social History Narrative   Not on file   Social Drivers of Health   Financial Resource Strain: Not on file  Food Insecurity: Not on file  Transportation Needs: Not on file  Physical Activity: Not on file  Stress: Not on file  Social Connections: Not on file     Family History: The patient's family history includes Hypertension in her maternal grandmother. ROS:   Please see the history of present illness.    All 14 point review  of systems negative except as described per history of present illness.  EKGs/Labs/Other Studies Reviewed:    The following studies were reviewed today:   EKG:  EKG Interpretation Date/Time:  Wednesday April 27 2024 08:40:09 EDT Ventricular Rate:  77 PR Interval:  140 QRS Duration:  70 QT Interval:  364 QTC Calculation: 411 R Axis:   26  Text Interpretation: Normal sinus rhythm Normal ECG No previous ECGs available Confirmed by Liborio Hai reddy [phone]) on 04/27/2024 8:49:17 AM    Recent Labs: No results found for requested labs within last 365 days.  Recent Lipid Panel No results found for: CHOL, TRIG, HDL, CHOLHDL, VLDL, LDLCALC, LDLDIRECT  Physical Exam:    VS:  BP (!) 130/90   Pulse 77   Ht 5' 5 (1.651 m)   Wt 149 lb (67.6 kg)   SpO2 99%   BMI 24.79 kg/m     Wt Readings from Last 3 Encounters:  04/27/24 149 lb (67.6 kg)     GENERAL:  Well nourished, well developed in no acute distress NECK: No JVD; No carotid bruits CARDIAC: RRR, S1 and  S2 present, no murmurs, no rubs, no gallops CHEST:  Clear to auscultation without rales, wheezing or rhonchi  Extremities: No pitting pedal edema. Pulses bilaterally symmetric with radial 2+ and dorsalis pedis 2+ NEUROLOGIC:  Alert and oriented x 3  Medication Adjustments/Labs and Tests Ordered: Current medicines are reviewed at length with the patient today.  Concerns regarding medicines are outlined above.  Orders Placed This Encounter  Procedures   Cardiac Stress Test: Informed Consent Details: Physician/Practitioner Attestation; Transcribe to consent form and obtain patient signature   EKG 12-Lead   No orders of the defined types were placed in this encounter.   Signed, Hai jess Liborio, MD, MPH, [facility]. 04/27/2024 9:17 AM    [facility]

## 2024-04-27 NOTE — Patient Instructions (Signed)
 Medication Instructions:  Your physician has recommended you make the following change in your medication:   START: Carvedilol  3.125 mg two times daily  *If you need a refill on your cardiac medications before your next appointment, please call your pharmacy*  Lab Work: None If you have labs (blood work) drawn today and your tests are completely normal, you will receive your results only by: MyChart Message (if you have MyChart) OR A paper copy in the mail If you have any lab test that is abnormal or we need to change your treatment, we will call you to review the results.  Testing/Procedures: Your physician has requested that you have an echocardiogram. Echocardiography is a painless test that uses sound waves to create images of your heart. It provides your doctor with information about the size and shape of your heart and how well your heart's chambers and valves are working. This procedure takes approximately one hour. There are no restrictions for this procedure. Please do NOT wear cologne, perfume, aftershave, or lotions (deodorant is allowed). Please arrive 15 minutes prior to your appointment time.  Please note: We ask at that you not bring children with you during ultrasound (echo/ vascular) testing. Due to room size and safety concerns, children are not allowed in the ultrasound rooms during exams. Our front office staff cannot provide observation of children in our lobby area while testing is being conducted. An adult accompanying a patient to their appointment will only be allowed in the ultrasound room at the discretion of the ultrasound technician under special circumstances. We apologize for any inconvenience.     [facility] [address] [place], [place] [address] Phone:  [phone]    Please arrive 15 minutes prior to your appointment time for registration and insurance purposes.  The test will take approximately 3 to 4 hours to  complete; you may bring reading material.  If someone comes with you to your appointment, they will need to remain in the main lobby due to limited space in the testing area. **If you are pregnant or breastfeeding, please notify the nuclear lab prior to your appointment**  How to prepare for your Myocardial Perfusion Test: Do not eat or drink 3 hours prior to your test, except you may have water. Do not consume products containing caffeine (regular or decaffeinated) 12 hours prior to your test. (ex: coffee, chocolate, sodas, tea). Do bring a list of your current medications with you.  If not listed below, you may take your medications as normal. Do not take carvedilol  (Coreg ) for 24 hours prior to the test.  Bring the medication to your appointment as you may be required to take it once the test is complete. Do wear comfortable clothes (no dresses or overalls) and walking shoes, tennis shoes preferred (No heels or open toe shoes are allowed). Do NOT wear cologne, perfume, aftershave, or lotions (deodorant is allowed). If these instructions are not followed, your test will have to be rescheduled.  Please report to [address] for your test.  If you have questions or concerns about your appointment, you can call the [facility] at [phone].  If you cannot keep your appointment, please provide 24 hours notification to the Nuclear Lab, to avoid a possible $50 charge to your account.   Follow-Up: At [facility], you and your health needs are our priority.  As part of our continuing mission to provide you with exceptional heart care, our  providers are all part of one team.  This team includes your primary Cardiologist (physician) and Advanced Practice Providers or APPs (Physician Assistants and Nurse Practitioners) who all work together to provide you with the care you need, when you need it.  Your next appointment:   3 month(s)  Provider:    Alean Kobus, MD    We recommend signing up for the patient portal called MyChart.  Sign up information is provided on this After Visit Summary.  MyChart is used to connect with patients for Virtual Visits (Telemedicine).  Patients are able to view lab/test results, encounter notes, upcoming appointments, etc.  Non-urgent messages can be sent to your provider as well.   To learn more about what you can do with MyChart, go to [email or web address].   Other Instructions None

## 2024-05-11 DIAGNOSIS — M8580 Other specified disorders of bone density and structure, unspecified site: Secondary | ICD-10-CM | POA: Diagnosis not present

## 2024-05-11 DIAGNOSIS — E0590 Thyrotoxicosis, unspecified without thyrotoxic crisis or storm: Secondary | ICD-10-CM | POA: Diagnosis not present

## 2024-05-11 DIAGNOSIS — E2839 Other primary ovarian failure: Secondary | ICD-10-CM | POA: Diagnosis not present

## 2024-05-11 DIAGNOSIS — M8589 Other specified disorders of bone density and structure, multiple sites: Secondary | ICD-10-CM | POA: Diagnosis not present

## 2024-05-12 DIAGNOSIS — M8589 Other specified disorders of bone density and structure, multiple sites: Secondary | ICD-10-CM | POA: Diagnosis not present

## 2024-05-17 ENCOUNTER — Telehealth: Payer: Self-pay | Admitting: *Deleted

## 2024-05-17 ENCOUNTER — Other Ambulatory Visit: Payer: Self-pay | Admitting: Medical Genetics

## 2024-05-17 NOTE — Telephone Encounter (Signed)
 Pt given instructions for MPI study. Hold Carvedilol .

## 2024-05-18 ENCOUNTER — Ambulatory Visit: Payer: Self-pay

## 2024-05-18 ENCOUNTER — Ambulatory Visit

## 2024-05-18 ENCOUNTER — Encounter

## 2024-05-18 DIAGNOSIS — I4729 Other ventricular tachycardia: Secondary | ICD-10-CM | POA: Diagnosis not present

## 2024-05-18 DIAGNOSIS — I10 Essential (primary) hypertension: Secondary | ICD-10-CM | POA: Diagnosis not present

## 2024-05-18 DIAGNOSIS — I471 Supraventricular tachycardia, unspecified: Secondary | ICD-10-CM

## 2024-05-18 DIAGNOSIS — I340 Nonrheumatic mitral (valve) insufficiency: Secondary | ICD-10-CM | POA: Insufficient documentation

## 2024-05-18 HISTORY — DX: Nonrheumatic mitral (valve) insufficiency: I34.0

## 2024-05-18 LAB — ECHOCARDIOGRAM COMPLETE
Area-P 1/2: 3.47 cm2
MV M vel: 5.67 m/s
MV Peak grad: 128.6 mmHg
Radius: 0.4 cm
S' Lateral: 2.4 cm

## 2024-05-23 ENCOUNTER — Telehealth: Payer: Self-pay

## 2024-05-23 NOTE — Telephone Encounter (Signed)
 Patient states on her initial visit Dr. Liborio he added a beta blocker- Carvedilol - After her second dose of the day her symptoms include 90-95/64-65 and staying that way. She dropped it to one per day herself and now her bp is staying low all the time- patient states she cannot function this way- [phone]   Please advise

## 2024-05-24 ENCOUNTER — Ambulatory Visit

## 2024-05-25 ENCOUNTER — Ambulatory Visit

## 2024-05-25 DIAGNOSIS — I4729 Other ventricular tachycardia: Secondary | ICD-10-CM | POA: Diagnosis not present

## 2024-05-25 DIAGNOSIS — I471 Supraventricular tachycardia, unspecified: Secondary | ICD-10-CM | POA: Diagnosis not present

## 2024-05-25 DIAGNOSIS — M8580 Other specified disorders of bone density and structure, unspecified site: Secondary | ICD-10-CM | POA: Diagnosis not present

## 2024-05-25 DIAGNOSIS — Z8669 Personal history of other diseases of the nervous system and sense organs: Secondary | ICD-10-CM | POA: Diagnosis not present

## 2024-05-25 DIAGNOSIS — Z6825 Body mass index (BMI) 25.0-25.9, adult: Secondary | ICD-10-CM | POA: Diagnosis not present

## 2024-05-26 ENCOUNTER — Telehealth: Payer: Self-pay | Admitting: Diagnostic Neuroimaging

## 2024-05-26 NOTE — Telephone Encounter (Signed)
 Receiveed sleep referral from [facility] for hx of sleep apnea Placed in sleep referrals box

## 2024-05-31 ENCOUNTER — Encounter (HOSPITAL_COMMUNITY): Payer: Self-pay | Admitting: *Deleted

## 2024-06-06 ENCOUNTER — Other Ambulatory Visit: Payer: Self-pay

## 2024-06-06 MED ORDER — METOPROLOL SUCCINATE ER 25 MG PO TB24: 12.5000 mg | ORAL_TABLET | Freq: Every day | ORAL | 3 refills | Status: DC

## 2024-06-06 NOTE — Telephone Encounter (Signed)
 Metoprolol  XL 25mg  1/2 tablet daily per Dr. Leandrew note

## 2024-06-08 ENCOUNTER — Ambulatory Visit

## 2024-06-08 DIAGNOSIS — I4729 Other ventricular tachycardia: Secondary | ICD-10-CM | POA: Diagnosis not present

## 2024-06-08 DIAGNOSIS — I471 Supraventricular tachycardia, unspecified: Secondary | ICD-10-CM | POA: Diagnosis not present

## 2024-06-08 DIAGNOSIS — I10 Essential (primary) hypertension: Secondary | ICD-10-CM | POA: Diagnosis not present

## 2024-06-08 LAB — MYOCARDIAL PERFUSION IMAGING
Angina Index: 0
Estimated workload: 8.5
Exercise duration (min): 6 min
Exercise duration (sec): 30 s
LV dias vol: 53 mL (ref 46–106)
LV sys vol: 17 mL (ref 3.8–5.2)
MPHR: 152 {beats}/min
Nuc Stress EF: 67 %
Peak HR: 144 {beats}/min
Percent HR: 94 %
RPE: 18
Rest HR: 71 {beats}/min
Rest Nuclear Isotope Dose: 9.6 mCi
SDS: 6
SRS: 7
SSS: 13
Stress Nuclear Isotope Dose: 28.9 mCi
TID: 1.05

## 2024-06-08 MED ORDER — TECHNETIUM TC 99M TETROFOSMIN IV KIT
28.9000 | PACK | Freq: Once | INTRAVENOUS | Status: AC | PRN
  Administered 2024-06-08: 28.9 via INTRAVENOUS

## 2024-06-08 MED ORDER — TECHNETIUM TC 99M TETROFOSMIN IV KIT
9.6000 | PACK | Freq: Once | INTRAVENOUS | Status: AC | PRN
  Administered 2024-06-08: 9.6 via INTRAVENOUS

## 2024-06-30 ENCOUNTER — Institutional Professional Consult (permissible substitution): Admitting: Neurology

## 2024-07-12 ENCOUNTER — Ambulatory Visit: Admitting: Neurology

## 2024-07-12 ENCOUNTER — Encounter: Payer: Self-pay | Admitting: Neurology

## 2024-07-12 VITALS — BP 124/74 | HR 82 | Ht 64.0 in | Wt 150.6 lb

## 2024-07-12 DIAGNOSIS — G4719 Other hypersomnia: Secondary | ICD-10-CM | POA: Diagnosis not present

## 2024-07-12 DIAGNOSIS — E663 Overweight: Secondary | ICD-10-CM | POA: Diagnosis not present

## 2024-07-12 DIAGNOSIS — R519 Headache, unspecified: Secondary | ICD-10-CM

## 2024-07-12 DIAGNOSIS — R634 Abnormal weight loss: Secondary | ICD-10-CM

## 2024-07-12 DIAGNOSIS — I4729 Other ventricular tachycardia: Secondary | ICD-10-CM | POA: Diagnosis not present

## 2024-07-12 DIAGNOSIS — R351 Nocturia: Secondary | ICD-10-CM | POA: Diagnosis not present

## 2024-07-12 DIAGNOSIS — Z8669 Personal history of other diseases of the nervous system and sense organs: Secondary | ICD-10-CM

## 2024-07-12 NOTE — Patient Instructions (Signed)
 Thank you for choosing [facility] for your sleep related care! It was nice to meet you today!   Here is what we discussed today:    Based on your symptoms and your exam I believe you may still be at risk for obstructive sleep apnea (aka OSA). We should proceed with a sleep study to determine whether you do or do not have OSA and how severe it is. Even, if you have mild OSA, I may want you to consider treatment with CPAP, as treatment of even borderline or mild sleep apnea can result and improvement of symptoms such as sleep disruption, daytime sleepiness, nighttime bathroom breaks, restless leg symptoms, improvement of headache syndromes, even improved mood disorder.   As explained, an attended sleep study (meaning you get to stay overnight in the sleep lab), lets [place] monitor sleep-related behaviors such as sleep talking and leg movements in sleep, in addition to monitoring for sleep apnea.  A home sleep test is a screening tool for sleep apnea diagnosis only, but unfortunately, does not help with any other sleep-related diagnoses.  Please remember, the long-term risks and ramifications of untreated moderate to severe obstructive sleep apnea may include (but are not limited to): increased risk for cardiovascular disease, including congestive heart failure, stroke, difficult to control hypertension, treatment resistant obesity, arrhythmias, especially irregular heartbeat commonly known as A. Fib. (atrial fibrillation); even type 2 diabetes has been linked to untreated OSA.   Other correlations that untreated obstructive sleep apnea include macular edema which is swelling of the retina in the eyes, droopy eyelid syndrome, and elevated hemoglobin and hematocrit levels (often referred to as polycythemia).  Sleep apnea can cause disruption of sleep and sleep deprivation in most cases, which, in turn, can cause recurrent headaches, problems with memory, mood, concentration, focus, and  vigilance. Most people with untreated sleep apnea report excessive daytime sleepiness, which can affect their ability to drive. Please do not drive or use heavy equipment or machinery, if you feel sleepy! Patients with sleep apnea can also develop difficulty initiating and maintaining sleep (aka insomnia).   Having sleep apnea may increase your risk for other sleep disorders, including involuntary behaviors sleep such as sleep terrors, sleep talking, sleepwalking.    Having sleep apnea can also increase your risk for restless leg syndrome and leg movements at night.   Please note that untreated obstructive sleep apnea may carry additional perioperative morbidity. Patients with significant obstructive sleep apnea (typically, in the moderate to severe degree) should receive, if possible, perioperative PAP (positive airway pressure) therapy and the surgeons and particularly the anesthesiologists should be informed of the diagnosis and the severity of the sleep disordered breathing.   We will call you or email you through MyChart with regards to your test results and plan a follow-up in sleep clinic accordingly. Most likely, you will hear from one of our nurses.   Our sleep lab administrative assistant will call you to schedule your sleep study and give you further instructions, regarding the check in process for the sleep study, arrival time, what to bring, when you can expect to leave after the study, etc., and to answer any other logistical questions you may have. If you don't hear back from her by about 2 weeks from now, please feel free to call her direct line at [phone] or you can call our general clinic number, or email [place] through My Chart.

## 2024-07-12 NOTE — Progress Notes (Signed)
 Subjective:    Patient ID: Christy Crane is a 68 y.o. female.  HPI    True Mar, MD, PhD [facility] [address] [address] [place], [place] [address]  Dear Dr. Melonie,   I saw your patient, Christy Crane, upon your kind request in my sleep clinic today for initial consultation of her sleep disorder, in particular, her prior diagnosis of OSA. The patient is unaccompanied today. As you know, Ms. Rickles is a 68 year old female with an underlying history of NSVT, hypothyroidism, reflux disease, osteopenia, hyperlipidemia, and mildly overweight state, who was previously diagnosed with obstructive sleep apnea and placed on PAP therapy.  Original sleep testing was in 2016 or thereabouts.  She had a home sleep test through pulmonology in 2024 which was negative for sleep apnea and she was advised no longer to use her AutoPap.  She reports snoring and daytime somnolence and sleep disruption and her cardiologist encouraged her to get checked out again.  She has lost quite a bit of weight in the realm of 90 pounds in the past few years.  She is widowed and lives alone.  She stopped using her AutoPap machine in 2024.  I do have compliance data available from a machine that was her husband's machine and she set the pressure to 4 cm, previously reports being on 8 cm.  Her bedtime is generally around 11 or midnight and rise time between 645 and 7:30 AM.  She does not drink daily caffeine.  She tries to hydrate well with water.  She does not drink any alcohol  and she is a non-smoker.  I reviewed your office note from 05/25/2024.  I was able to review her pulmonology clinic note from 05/06/2023.  She saw Eleanor Ee, NP in [place] , this is [facility].  She brought a copy of her home sleep test report from 04/29/2023.  Her AHI was 1.9/h, O2 nadir 91%.  She would like to be reevaluated with a laboratory study.  Her Past Medical History  Is Significant For: Past Medical History:  Diagnosis Date   BMI 25.0-25.9,adult    SVT (supraventricular tachycardia)    Ventricular tachycardia (HCC)     Her Past Surgical History Is Significant For: Past Surgical History:  Procedure Laterality Date   BREAST LUMPECTOMY     CESAREAN SECTION     CHOLECYSTECTOMY     DILATION AND CURETTAGE OF UTERUS     LAMINECTOMY      Her Family History Is Significant For: Family History  Problem Relation Age of Onset   Hypertension Maternal Grandmother    Sleep apnea Neg Hx     Her Social History Is Significant For: Social History   Socioeconomic History   Marital status: Married    Spouse name: Not on file   Number of children: Not on file   Years of education: Not on file   Highest education level: Not on file  Occupational History   Not on file  Tobacco Use   Smoking status: Never   Smokeless tobacco: Never  Vaping Use   Vaping status: Never Used  Substance and Sexual Activity   Alcohol  use: Not Currently    Comment: Occasional   Drug use: Never   Sexual activity: Not on file  Other Topics Concern   Not on file  Social History Narrative   Pt lives alone    Retired    Social Drivers of Health  Financial Resource Strain: Not on file  Food Insecurity: Not on file  Transportation Needs: Not on file  Physical Activity: Not on file  Stress: Not on file  Social Connections: Not on file    Her Allergies Are:  Allergies  Allergen Reactions   Aspirin Nausea Only   Oxycodone Other (See Comments)    Pt reports headaches  :   Her Current Medications Are:  Outpatient Encounter Medications as of 07/12/2024  Medication Sig   amLODipine (NORVASC) 2.5 MG tablet Take 2.5 mg by mouth daily.   Cholecalciferol (D3 5000) 125 MCG (5000 UT) capsule Take 5,000 Units by mouth daily.   cyclobenzaprine (FLEXERIL) 10 MG tablet Take 10 mg by mouth 3 (three) times daily as needed for muscle spasms.   levothyroxine (SYNTHROID) 88 MCG  tablet Take 88 mcg by mouth daily.   Magnesium Malate 1250 (141.7 Mg) MG TABS Take 1 tablet by mouth daily.   metoprolol  succinate (TOPROL  XL) 25 MG 24 hr tablet Take 0.5 tablets (12.5 mg total) by mouth daily.   Multiple Vitamin (MULTIVITAMIN PO) Take 1 tablet by mouth daily.   omeprazole (PRILOSEC) 20 MG capsule Take 20 mg by mouth daily.   simvastatin (ZOCOR) 10 MG tablet Take 10 mg by mouth every other day.   tolterodine (DETROL LA) 4 MG 24 hr capsule Take 4 mg by mouth daily.   No facility-administered encounter medications on file as of 07/12/2024.  :   Review of Systems:  Out of a complete 14 point review of systems, all are reviewed and negative with the exception of these symptoms as listed below:  Review of Systems  Neurological:        Pt here for sleep consult Pt snores,fatigue,headache hypertension     Objective:  Neurological Exam  Physical Exam Physical Examination:   Vitals:   07/12/24 1009  BP: 124/74  Pulse: 82    General Examination: The patient is a very pleasant 68 y.o. female in no acute distress. She appears well-developed and well-nourished and well groomed.   HEENT: Normocephalic, atraumatic, pupils are equal, round and reactive to light, extraocular tracking is good without limitation to gaze excursion or nystagmus noted. Hearing is grossly intact. Face is symmetric with normal facial animation. Speech is clear with no dysarthria noted. There is no hypophonia. There is no lip, neck/head, jaw or voice tremor. Neck is supple with full range of passive and active motion. There are no carotid bruits on auscultation. Oropharynx exam reveals: mild mouth dryness, adequate dental hygiene and moderate airway crowding, due to small airway entry, Mallampati class IV, small tonsils, prominent uvula, tongue protrudes centrally and palate elevates symmetrically, neck circumference 13-7/8 inches, minimal overbite noted.  Chest: Clear to auscultation without wheezing,  rhonchi or crackles noted.  Heart: S1+S2+0, regular and normal without murmurs, rubs or gallops noted.   Abdomen: Soft, non-tender and non-distended.  Extremities: There is no pitting edema in the distal lower extremities bilaterally.   Skin: Warm and dry without trophic changes noted.   Musculoskeletal: exam reveals no obvious joint deformities.   Neurologically:  Mental status: The patient is awake, alert and oriented in all 4 spheres. Her immediate and remote memory, attention, language skills and fund of knowledge are appropriate. There is no evidence of aphasia, agnosia, apraxia or anomia. Speech is clear with normal prosody and enunciation. Thought process is linear. Mood is normal and affect is normal.  Cranial nerves II - XII are as described above under HEENT exam.  Motor exam: Normal bulk, strength and tone is noted. There is no obvious action or resting tremor.  Fine motor skills and coordination: grossly intact.  Cerebellar testing: No dysmetria or intention tremor. There is no truncal or gait ataxia.  Sensory exam: intact to light touch in the upper and lower extremities.  Gait, station and balance: She stands easily. No veering to one side is noted. No leaning to one side is noted. Posture is age-appropriate and stance is narrow based. Gait shows normal stride length and normal pace. No problems turning are noted.   Assessment and Plan:   In summary, Christy Crane is a very pleasant 68 year old female with an underlying history of NSVT, hypothyroidism, reflux disease, osteopenia, hyperlipidemia, and mildly overweight state, who presents for evaluation of her obstructive sleep apnea, originally diagnosed in 2016 and she was on AutoPap therapy for years but stopped using her machine after a negative home sleep test in 2024.  She still has clinical symptoms and would like to be reevaluated which is clinically indicated especially in light of her cardiac arrhythmia diagnosis.  She  also benefited from PAP therapy in the past.  She would be eligible for a new machine and we will proceed with a laboratory attended sleep study.   I had a long chat with the patient about my findings and the diagnosis of sleep apnea, particularly OSA, its prognosis and treatment options. We talked about medical/conservative treatments, surgical interventions and non-pharmacological approaches for symptom control. I explained, in particular, the risks and ramifications of untreated moderate to severe OSA, especially with respect to developing cardiovascular disease down the road, including congestive heart failure (CHF), difficult to treat hypertension, cardiac arrhythmias (particularly A-fib), neurovascular complications including TIA, stroke and dementia. Even type 2 diabetes has, in part, been linked to untreated OSA. Symptoms of untreated OSA may include (but may not be limited to) daytime sleepiness, nocturia (i.e. frequent nighttime urination), memory problems, mood irritability and suboptimally controlled or worsening mood disorder such as depression and/or anxiety, lack of energy, lack of motivation, physical discomfort, as well as recurrent headaches, especially morning or nocturnal headaches. We talked about the importance of maintaining a healthy lifestyle and striving for healthy weight. I outlined the differences between a laboratory attended sleep study which is considered more comprehensive and accurate over the option of a home sleep test (HST); the latter may lead to underestimation of sleep disordered breathing in some instances and does not help with diagnosing upper airway resistance syndrome and is not accurate enough to diagnose primary central sleep apnea typically. I outlined possible surgical and non-surgical treatment options of OSA, including the use of a positive airway pressure (PAP) device (i.e. CPAP, AutoPAP/APAP or BiPAP in certain circumstances), a custom-made dental device (aka  oral appliance, which would require a referral to a specialist dentist or orthodontist typically, and is generally speaking not considered for patients with full dentures or edentulous state), upper airway surgical options, such as traditional UPPP (which is not considered a first-line treatment) or the Inspire device (hypoglossal nerve stimulator, which would involve a referral for consultation with an ENT surgeon, after careful selection, following inclusion criteria - also not first-line treatment). I explained the PAP treatment option to the patient in detail, as this is generally considered first-line treatment.  The patient indicated that she would be willing to restart PAP therapy, if the need arises. I explained the importance of being compliant with PAP treatment, not only for insurance purposes but primarily to  improve patient's symptoms symptoms, and for the patient's long term health benefit, including to reduce Her cardiovascular risks longer-term.    We will pick up our discussion about the next steps and treatment options after testing.  We will keep her posted as to the test results by phone call and/or MyChart messaging where possible.  We will plan to follow-up in sleep clinic accordingly as well.  I answered all her questions today and the patient was in agreement.   I encouraged her to call with any interim questions, concerns, problems or updates or email us  through MyChart.  Generally speaking, sleep test authorizations may take up to 2 weeks, sometimes less, sometimes longer, the patient is encouraged to get in touch with us  if they do not hear back from the sleep lab staff directly within the next 2 weeks.  Thank you very much for allowing me to participate in the care of this nice patient. If I can be of any further assistance to you please do not hesitate to call me at [phone].  Sincerely,   True Mar, MD, PhD

## 2024-07-20 ENCOUNTER — Telehealth: Payer: Self-pay | Admitting: Neurology

## 2024-07-20 NOTE — Telephone Encounter (Signed)
 [employer] pending new auth through [employer]

## 2024-07-21 NOTE — Telephone Encounter (Signed)
 Sent mychart

## 2024-07-21 NOTE — Telephone Encounter (Signed)
 [employer] Christy Crane: [id number] (exp. 07/26/24 to 10/05/24)

## 2024-08-01 ENCOUNTER — Ambulatory Visit

## 2024-08-01 VITALS — BP 140/82 | HR 74 | Temp 97.6°F | Ht 61.0 in | Wt 151.6 lb

## 2024-08-01 DIAGNOSIS — I10 Essential (primary) hypertension: Secondary | ICD-10-CM | POA: Diagnosis not present

## 2024-08-01 DIAGNOSIS — I471 Supraventricular tachycardia, unspecified: Secondary | ICD-10-CM | POA: Diagnosis not present

## 2024-08-01 DIAGNOSIS — I472 Ventricular tachycardia, unspecified: Secondary | ICD-10-CM | POA: Diagnosis not present

## 2024-08-01 DIAGNOSIS — I340 Nonrheumatic mitral (valve) insufficiency: Secondary | ICD-10-CM

## 2024-08-01 NOTE — Progress Notes (Signed)
 Cardiology Consultation:    Date:  08/01/2024   ID:  Christy Crane, DOB 08/24/1956, MRN [id number]  PCP:  Christy Hun, MD  Cardiologist:  Christy Crane Christy Eshleman, MD   Referring MD: No ref. provider found   No chief complaint on file.    ASSESSMENT AND PLAN:   Ms. Christy Crane 68 year old woman with history of hypertension, palpitations associated with short runs of SVT and 1 short run of NSVT on Zio patch May 2025, hyperlipidemia, hypothyroidism, previously with history of sleep apnea [recheck January 2024 home study after with CPAP was discontinued] now with persistent symptoms of sleep apnea, pending reassessment with a sleep study November 2025.  Cardiac evaluation with echocardiogram May 18, 2024 noted normal biventricular function with grade 1 diastolic dysfunction and mild to moderate dilated left atrium and moderate MR, stress test with exercise nuclear imaging study 06/08/2024 noted 6-minute and 30 seconds on treadmill 94% MPHR, 8.5 METS without any evidence of ischemia.  Here for follow-up visit Problem List Items Addressed This Visit     SVT (supraventricular tachycardia)   Symptoms improved with metoprolol  low-dose. Continue the same. Okay to titrate of the metoprolol  if symptoms increase in intensity.      Ventricular tachycardia (HCC)   1 episode of NSVT on Zio patch May 2025. Echocardiogram and stress test findings are reassuring. Currently on low-dose beta-blockers. Continue same.       Hypertension - Primary   Well-controlled at home. Suboptimal here at the office today. Advised her to have validated advise with PCPs office or our office. Continue amlodipine 2.5 mg once daily Continue metoprolol  12.5 mg once daily [did not tolerate carvedilol  well].       Moderate mitral regurgitation, by echocardiogram 05/18/2024   Reviewed the findings of the echocardiogram pertaining to mitral regurgitation, atrial dilatation.  Discussed potential risk for atrial  arrhythmias with these findings.  Will continue to monitor MR with repeat echocardiogram tentatively in 1 year.       Return to clinic tentatively in 7 to 8 months.   History of Present Illness:    Christy Crane is a 68 y.o. female who is being seen today for follow-up .  PCP is Dr. Cathaleen. Last visit with me in the office was 04/27/2024.  Here for the visit by herself.  Lives by herself at home.  Continues to keep herself busy with household activities and regular exercise at home.  history of hypertension, hyperlipidemia, hypothyroidism, previously sleep apnea [rechecked around January 2024 with home sleep study and noted to have normal sleep apnea symptoms and CPAP discontinued] now pending reassessment with repeat sleep study scheduled in November.  With recent history of palpitations she underwent Zio patch 14-day study May 2025 had short runs of SVT suggestive of ectopic atrial tachycardia and 1 episode of 6 beat NSVT, ischemic workup 06/08/2024 with stress test nuclear imaging showed normal findings no ischemia, exercised 6 minutes 30 seconds on the treadmill attaining 94% MPHR and 8.5 METS,Echocardiogram 05/18/2024 noted normal biventricular function with LVEF 60 to 65%, grade 1 diastolic dysfunction, mild to moderately dilated left atrium, moderate mitral regurgitation.  Here today mentions she continues to feel better.  Intermittent palpitations about 10 short episodes in the past 2 to 3 months.  No sustained episodes.  Longest episode lasted about 20 seconds.  Denies any chest pain, shortness of breath, syncopal or near syncopal episodes.  Most of the episodes occur as she rests, before going to sleep.  Symptoms improved after  starting low-dose metoprolol  succinate 12.5 mg once daily.  Blood pressures have been well-controlled using her home monitor.   Past Medical History:  Diagnosis Date   BMI 25.0-25.9,adult    Hypertension 04/27/2024   Moderate mitral regurgitation, by  echocardiogram 05/18/2024 05/18/2024   SVT (supraventricular tachycardia)    Ventricular tachycardia (HCC)     Past Surgical History:  Procedure Laterality Date   BREAST LUMPECTOMY     CESAREAN SECTION     CHOLECYSTECTOMY     DILATION AND CURETTAGE OF UTERUS     LAMINECTOMY      Current Medications: Current Meds  Medication Sig   amLODipine (NORVASC) 2.5 MG tablet Take 2.5 mg by mouth daily.   Cholecalciferol (D3 5000) 125 MCG (5000 UT) capsule Take 5,000 Units by mouth daily.   cyclobenzaprine (FLEXERIL) 10 MG tablet Take 10 mg by mouth 3 (three) times daily as needed for muscle spasms.   levothyroxine (SYNTHROID) 88 MCG tablet Take 88 mcg by mouth daily.   Magnesium Malate 1250 (141.7 Mg) MG TABS Take 1 tablet by mouth daily.   metoprolol  succinate (TOPROL  XL) 25 MG 24 hr tablet Take 0.5 tablets (12.5 mg total) by mouth daily.   Multiple Vitamin (MULTIVITAMIN PO) Take 1 tablet by mouth daily.   omeprazole (PRILOSEC) 20 MG capsule Take 20 mg by mouth daily.   simvastatin (ZOCOR) 10 MG tablet Take 10 mg by mouth every other day.   tolterodine (DETROL LA) 4 MG 24 hr capsule Take 4 mg by mouth daily.     Allergies:   Aspirin and Oxycodone   Social History   Socioeconomic History   Marital status: Married    Spouse name: Not on file   Number of children: Not on file   Years of education: Not on file   Highest education level: Not on file  Occupational History   Not on file  Tobacco Use   Smoking status: Never   Smokeless tobacco: Never  Vaping Use   Vaping status: Never Used  Substance and Sexual Activity   Alcohol  use: Not Currently    Comment: Occasional   Drug use: Never   Sexual activity: Not on file  Other Topics Concern   Not on file  Social History Narrative   Pt lives alone    Retired    Social Drivers of Corporate investment banker Strain: Not on file  Food Insecurity: Not on file  Transportation Needs: Not on file  Physical Activity: Not on file   Stress: Not on file  Social Connections: Not on file     Family History: The patient's family history includes Hypertension in her maternal grandmother. There is no history of Sleep apnea. ROS:   Please see the history of present illness.    All 14 point review of systems negative except as described per history of present illness.  EKGs/Labs/Other Studies Reviewed:    The following studies were reviewed today:   EKG:       Recent Labs: No results found for requested labs within last 365 days.  Recent Lipid Panel No results found for: CHOL, TRIG, HDL, CHOLHDL, VLDL, LDLCALC, LDLDIRECT  Physical Exam:    VS:  BP (!) 140/82   Pulse 74   Ht 5' 1 (1.549 m)   Wt 151 lb 9.6 oz (68.8 kg)   SpO2 98%   BMI 28.64 kg/m     Wt Readings from Last 3 Encounters:  08/01/24 151 lb 9.6 oz (68.8 kg)  07/12/24 150 lb 9.6 oz (68.3 kg)  06/08/24 149 lb (67.6 kg)     GENERAL:  Well nourished, well developed in no acute distress NECK: No JVD; No carotid bruits CARDIAC: RRR, S1 and S2 present, no murmurs, no rubs, no gallops CHEST:  Clear to auscultation without rales, wheezing or rhonchi  Extremities: No pitting pedal edema. Pulses bilaterally symmetric with radial 2+ and dorsalis pedis 2+ NEUROLOGIC:  Alert and oriented x 3  Medication Adjustments/Labs and Tests Ordered: Current medicines are reviewed at length with the patient today.  Concerns regarding medicines are outlined above.  No orders of the defined types were placed in this encounter.  No orders of the defined types were placed in this encounter.   Signed, Christy jess Kobus, MD, MPH, [facility]. 08/01/2024 9:19 AM    [facility]

## 2024-08-01 NOTE — Assessment & Plan Note (Signed)
 Symptoms improved with metoprolol  low-dose. Continue the same. Okay to titrate of the metoprolol  if symptoms increase in intensity.

## 2024-08-01 NOTE — Assessment & Plan Note (Signed)
 1 episode of NSVT on Zio patch May 2025. Echocardiogram and stress test findings are reassuring. Currently on low-dose beta-blockers. Continue same.

## 2024-08-01 NOTE — Assessment & Plan Note (Signed)
 Well-controlled at home. Suboptimal here at the office today. Advised her to have validated advise with PCPs office or our office. Continue amlodipine 2.5 mg once daily Continue metoprolol  12.5 mg once daily [did not tolerate carvedilol  well].

## 2024-08-01 NOTE — Patient Instructions (Signed)
 Medication Instructions:  Your physician recommends that you continue on your current medications as directed. Please refer to the Current Medication list given to you today.  *If you need a refill on your cardiac medications before your next appointment, please call your pharmacy*  Lab Work: None If you have labs (blood work) drawn today and your tests are completely normal, you will receive your results only by: MyChart Message (if you have MyChart) OR A paper copy in the mail If you have any lab test that is abnormal or we need to change your treatment, we will call you to review the results.  Testing/Procedures: None  Follow-Up: At [facility], you and your health needs are our priority.  As part of our continuing mission to provide you with exceptional heart care, our providers are all part of one team.  This team includes your primary Cardiologist (physician) and Advanced Practice Providers or APPs (Physician Assistants and Nurse Practitioners) who all work together to provide you with the care you need, when you need it.  Your next appointment:   7 month(s)  Provider:   Alean Kobus, MD    We recommend signing up for the patient portal called MyChart.  Sign up information is provided on this After Visit Summary.  MyChart is used to connect with patients for Virtual Visits (Telemedicine).  Patients are able to view lab/test results, encounter notes, upcoming appointments, etc.  Non-urgent messages can be sent to your provider as well.   To learn more about what you can do with MyChart, go to [email or web address].   Other Instructions None

## 2024-08-01 NOTE — Assessment & Plan Note (Signed)
 Reviewed the findings of the echocardiogram pertaining to mitral regurgitation, atrial dilatation.  Discussed potential risk for atrial arrhythmias with these findings.  Will continue to monitor MR with repeat echocardiogram tentatively in 1 year.

## 2024-08-18 ENCOUNTER — Other Ambulatory Visit: Payer: Self-pay | Admitting: Medical Genetics

## 2024-08-18 DIAGNOSIS — Z006 Encounter for examination for normal comparison and control in clinical research program: Secondary | ICD-10-CM

## 2024-08-26 DIAGNOSIS — J020 Streptococcal pharyngitis: Secondary | ICD-10-CM | POA: Diagnosis not present

## 2024-08-26 DIAGNOSIS — R051 Acute cough: Secondary | ICD-10-CM | POA: Diagnosis not present

## 2024-08-26 DIAGNOSIS — Z6825 Body mass index (BMI) 25.0-25.9, adult: Secondary | ICD-10-CM | POA: Diagnosis not present

## 2024-08-30 ENCOUNTER — Ambulatory Visit: Admitting: Neurology

## 2024-08-30 DIAGNOSIS — R351 Nocturia: Secondary | ICD-10-CM

## 2024-08-30 DIAGNOSIS — I4729 Other ventricular tachycardia: Secondary | ICD-10-CM

## 2024-08-30 DIAGNOSIS — R519 Headache, unspecified: Secondary | ICD-10-CM

## 2024-08-30 DIAGNOSIS — G4720 Circadian rhythm sleep disorder, unspecified type: Secondary | ICD-10-CM

## 2024-08-30 DIAGNOSIS — Z8669 Personal history of other diseases of the nervous system and sense organs: Secondary | ICD-10-CM

## 2024-08-30 DIAGNOSIS — E663 Overweight: Secondary | ICD-10-CM

## 2024-08-30 DIAGNOSIS — R9431 Abnormal electrocardiogram [ECG] [EKG]: Secondary | ICD-10-CM

## 2024-08-30 DIAGNOSIS — R634 Abnormal weight loss: Secondary | ICD-10-CM

## 2024-08-30 DIAGNOSIS — G4733 Obstructive sleep apnea (adult) (pediatric): Secondary | ICD-10-CM | POA: Diagnosis not present

## 2024-08-30 DIAGNOSIS — G4719 Other hypersomnia: Secondary | ICD-10-CM

## 2024-09-01 NOTE — Procedures (Signed)
 Physician Interpretation: Please see link under Procedure Tab or under Encounters tab for physician report, technical report, as well as O2 titration and/or PAP titration tables (if applicable).

## 2024-09-06 DIAGNOSIS — M8580 Other specified disorders of bone density and structure, unspecified site: Secondary | ICD-10-CM | POA: Diagnosis not present

## 2024-09-06 DIAGNOSIS — E039 Hypothyroidism, unspecified: Secondary | ICD-10-CM | POA: Diagnosis not present

## 2024-09-06 DIAGNOSIS — I10 Essential (primary) hypertension: Secondary | ICD-10-CM | POA: Diagnosis not present

## 2024-09-08 ENCOUNTER — Ambulatory Visit: Payer: Self-pay | Admitting: Neurology

## 2024-09-08 DIAGNOSIS — G4719 Other hypersomnia: Secondary | ICD-10-CM

## 2024-09-08 DIAGNOSIS — G4733 Obstructive sleep apnea (adult) (pediatric): Secondary | ICD-10-CM

## 2024-09-08 DIAGNOSIS — I4729 Other ventricular tachycardia: Secondary | ICD-10-CM

## 2024-09-13 DIAGNOSIS — I10 Essential (primary) hypertension: Secondary | ICD-10-CM | POA: Diagnosis not present

## 2024-09-13 DIAGNOSIS — Z6825 Body mass index (BMI) 25.0-25.9, adult: Secondary | ICD-10-CM | POA: Diagnosis not present

## 2024-09-13 DIAGNOSIS — I471 Supraventricular tachycardia, unspecified: Secondary | ICD-10-CM | POA: Diagnosis not present

## 2024-09-13 DIAGNOSIS — E785 Hyperlipidemia, unspecified: Secondary | ICD-10-CM | POA: Diagnosis not present

## 2024-09-13 DIAGNOSIS — E039 Hypothyroidism, unspecified: Secondary | ICD-10-CM | POA: Diagnosis not present

## 2024-09-16 LAB — GENECONNECT MOLECULAR SCREEN: Genetic Analysis Overall Interpretation: NEGATIVE

## 2024-09-19 MED ORDER — METOPROLOL SUCCINATE ER 25 MG PO TB24: 25.0000 mg | ORAL_TABLET | Freq: Every day | ORAL | 3 refills | Status: AC

## 2024-09-19 NOTE — Telephone Encounter (Signed)
 APAP order placed.  Please send to DME of choice.  Patient will need to follow-up within 30 to 90 days after set up date, please arrange for an appointment with one of our nurse practitioners or mysel, may do VV if patient prefers.

## 2024-09-21 NOTE — Telephone Encounter (Signed)
 I advised pt that Dr. Buck reviewed their sleep study results and found that pt has mild sleep apnea. Dr. Buck recommends that pt start autopap therapy. I reviewed PAP compliance expectations with the pt. Pt is agreeable to starting a CPAP. I advised pt that an order will be sent to a DME, [employer], and they will call the pt within about one week after they file with the pt's insurance. [employer] will show the pt how to use the machine, fit for masks, and troubleshoot the CPAP if needed.    I have sent the order to [employer] through community message.
